# Patient Record
Sex: Male | Born: 1965 | Race: Black or African American | Hispanic: No | Marital: Married | Smoking: Never smoker
Health system: Southern US, Community
[De-identification: ages and names within clinical notes are randomized; demographics above are authoritative.]

## PROBLEM LIST (undated history)

## (undated) DIAGNOSIS — B20 Human immunodeficiency virus [HIV] disease: Secondary | ICD-10-CM

## (undated) DIAGNOSIS — K219 Gastro-esophageal reflux disease without esophagitis: Secondary | ICD-10-CM

## (undated) DIAGNOSIS — IMO0002 Reserved for concepts with insufficient information to code with codable children: Secondary | ICD-10-CM

## (undated) HISTORY — PX: UPPER GASTROINTESTINAL ENDOSCOPY: SHX188

## (undated) HISTORY — DX: Gastro-esophageal reflux disease without esophagitis: K21.9

## (undated) HISTORY — DX: Reserved for concepts with insufficient information to code with codable children: IMO0002

## (undated) HISTORY — PX: COLONOSCOPY: SHX174

## (undated) HISTORY — DX: Human immunodeficiency virus (HIV) disease: B20

## (undated) HISTORY — PX: MULTIPLE TOOTH EXTRACTIONS: SHX2053

---

## 1999-11-16 ENCOUNTER — Inpatient Hospital Stay (HOSPITAL_COMMUNITY): Admission: EM | Admit: 1999-11-16 | Discharge: 1999-11-18 | Payer: Self-pay

## 2006-03-08 ENCOUNTER — Ambulatory Visit: Payer: Self-pay | Admitting: Internal Medicine

## 2009-06-06 HISTORY — PX: OTHER SURGICAL HISTORY: SHX169

## 2010-01-03 ENCOUNTER — Ambulatory Visit: Payer: Self-pay | Admitting: Internal Medicine

## 2010-01-03 DIAGNOSIS — A630 Anogenital (venereal) warts: Secondary | ICD-10-CM

## 2010-01-03 DIAGNOSIS — K625 Hemorrhage of anus and rectum: Secondary | ICD-10-CM

## 2010-01-05 ENCOUNTER — Encounter: Payer: Self-pay | Admitting: Internal Medicine

## 2010-03-06 LAB — CONVERTED CEMR LAB
Cholesterol: 116 mg/dL (ref 0–200)
Glucose, Bld: 101 mg/dL — ABNORMAL HIGH (ref 70–99)
HDL: 35.8 mg/dL — ABNORMAL LOW (ref >39.0)
LDL Cholesterol: 72 mg/dL (ref 0–99)
Total CHOL/HDL Ratio: 3.2
Triglycerides: 43 mg/dL (ref 0–149)
VLDL: 9 mg/dL (ref 0–40)

## 2010-03-08 NOTE — Consult Note (Signed)
Summary: [facility]  [facility]   Imported By: Sherian Rein 01/11/2010 12:07:27  _____________________________________________________________________  External Attachment:    Type:   Image     Comment:   External Document  Appended Document: [facility] diagnosed with folliculitis

## 2010-03-08 NOTE — Assessment & Plan Note (Signed)
Summary: 2:00/30 M PER DEE /NEEDS TO BE CHECKED OUT AND WANTS TO DISCU...   Vital Signs:  Patient profile:   45 year old male Height:      73.25 inches Weight:      206 pounds BMI:     27.09 Temp:     98.5 degrees F oral Pulse rate:   70 / minute Pulse rhythm:   regular BP sitting:   130 / 80  (left arm) Cuff size:   large  Vitals Entered By: Mervin Hack CMA Duncan Dull) (January 03, 2010 2:07 PM) CC: check hemrrhoids   History of Present Illness: has been having some bright red blood on toilet paper intermittently for about 1 year None in stool or bowl Has rash or bumps that he can feel when he wipes  Bowels are regular---every morning after coffee  No stomach problems no N/V  weight is stable keeps busy with work  Has open area on right buttock from injury on riding mower about 1 month ago hasn't healed up  Preventive Screening-Counseling & Management  Alcohol-Tobacco     Smoking Status: never  Allergies (verified): No Known Drug Allergies  Past History:  Past Medical History: Unremarkable  Past Surgical History: Partial teeth removed  Family History: Dad died of MI @68  Mom helathy 1 brother 2 half sisters No HTN, DM No prostate or colon cancer  No colitis or Crohn's disease  Social History: Occupation:  Own Aeronautical engineer business Married--- 2 daughters Never Smoked Alcohol use-no Occupation:  employed Smoking Status:  never  Review of Systems  The patient denies chest pain, syncope, dyspnea on exertion, peripheral edema, prolonged cough, abdominal pain, severe indigestion/heartburn, depression, unusual weight change, and enlarged lymph nodes.         weight is not changed from last visit almost 4 years ago No wekaness No parasthesias  Physical Exam  General:  alert and normal appearance.   Neck:  supple, no masses, and normal carotid upstroke.   Lungs:  normal respiratory effort, no intercostal retractions, no accessory muscle use, and  normal breath sounds.   Heart:  normal rate, regular rhythm, no murmur, and no gallop.   Abdomen:  soft, non-tender, and no masses.   Rectal:  multiple apparent warts in the perirectal area no sig hemorrhoids no bleeding spots Stool brown and heme negative Genitalia:  no lesions on glans or shaft Prostate:  no gland enlargement and no nodules.   Extremities:  no edema Skin:  ulcer on right buttock Psych:  normally interactive, good eye contact, not anxious appearing, and not depressed appearing.     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 Information/Orders faxed/mailed.    Orders Added: 1)  Dermatology Referral [Derma] 2)  New Patient Level III [99203]   Immunization History:  Tetanus/Td Immunization History:    Tetanus/Td:  historical (03/08/2006)   Immunization History:  Tetanus/Td Immunization History:    Tetanus/Td:  Historical (03/08/2006)  Prior Medications: None Current Allergies (reviewed today): No known allergies   Appended Document: 2:00/30 M PER DEE /NEEDS TO BE CHECKED OUT AND WANTS TO DISCU...     Clinical Lists Changes  Orders: Added  new Service order of Admin 1st Vaccine (45409) - Signed Added new Service order of Flu Vaccine 71yrs + [phone]) - Signed Observations: Added new observation of FLU VAX VIS: 08/31/09 version (01/03/2010 14:45) Added new observation of FLU VAXLOT: [id number] (01/03/2010 14:45) Added new observation of FLU VAXMFR: [employer] (01/03/2010 14:45) Added new observation of FLU VAX EXP: 08/06/2010 (01/03/2010 14:45) Added new observation of FLU VAX DSE: 0.6ml (01/03/2010 14:45) Added new observation of FLU VAX: Fluvax 3+ (01/03/2010 14:45)Flu Vaccine Consent Questions     Do you have a history of severe allergic reactions to this vaccine? no    Any prior history of allergic reactions to egg and/or gelatin? no    Do you have a sensitivity to the preservative Thimersol? no    Do you have a past history of Guillan-Barre Syndrome? no    Do you currently have an acute febrile illness? no    Have you ever had a severe reaction to latex? no    Vaccine information given and explained to patient? yes    Are you currently pregnant? no    Lot Number:[id number]   Exp Date:08/06/2010   Site Given  Left Deltoid IM observation of FLU VAX DSE: 0.14ml (01/03/2010 14:45) Added new observation of FLU VAX: Fluvax 3+ (01/03/2010 14:45)     .[email or web address]
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
## 2010-07-12 ENCOUNTER — Ambulatory Visit: Payer: BC Managed Care – PPO

## 2010-07-14 ENCOUNTER — Ambulatory Visit: Payer: BC Managed Care – PPO

## 2010-07-23 ENCOUNTER — Encounter: Payer: Self-pay | Admitting: Internal Medicine

## 2010-07-25 ENCOUNTER — Encounter: Payer: Self-pay | Admitting: Family Medicine

## 2010-07-25 ENCOUNTER — Ambulatory Visit (INDEPENDENT_AMBULATORY_CARE_PROVIDER_SITE_OTHER): Payer: BC Managed Care – PPO | Admitting: Family Medicine

## 2010-07-25 DIAGNOSIS — Z21 Asymptomatic human immunodeficiency virus [HIV] infection status: Secondary | ICD-10-CM | POA: Insufficient documentation

## 2010-07-25 DIAGNOSIS — R1310 Dysphagia, unspecified: Secondary | ICD-10-CM | POA: Insufficient documentation

## 2010-07-25 DIAGNOSIS — Z113 Encounter for screening for infections with a predominantly sexual mode of transmission: Secondary | ICD-10-CM | POA: Insufficient documentation

## 2010-07-25 DIAGNOSIS — B20 Human immunodeficiency virus [HIV] disease: Secondary | ICD-10-CM | POA: Insufficient documentation

## 2010-07-25 LAB — RPR: RPR Ser Ql: REACTIVE — AB

## 2010-07-25 LAB — RPR TITER: RPR Titer: 1:1 {titer}

## 2010-07-25 MED ORDER — OMEPRAZOLE 40 MG PO CPDR: 40.0000 mg | DELAYED_RELEASE_CAPSULE | Freq: Every day | ORAL | Status: DC

## 2010-07-25 NOTE — Assessment & Plan Note (Signed)
Will rescreen for STDs per pt's request. Discussed protection 100% while testing returns, states will not be sexually active. H/o syphilis s/p treatment per patient 3 years ago.

## 2010-07-25 NOTE — Assessment & Plan Note (Addendum)
What sounds like esophageal dysphagia with some GERD sxs, new onset x 1wk.   Start PPI, set up with endoscopy, GI referral. ? Esophageal erosion vs PUD vs other. Worse with acidic drinks.  No red flags.

## 2010-07-25 NOTE — Patient Instructions (Signed)
Start omeprazole 40mg  daily for stomach. Avoidance of citrus, fatty foods, chocolate, peppermint, and excessive alcohol, along with sodas, orange juice (acidic drinks) At least a few hours between dinner and bed, minimize naps after eating. We will call you to set up swallow study. Blood work today. Call us with questions.
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
## 2010-07-26 ENCOUNTER — Encounter: Payer: Self-pay | Admitting: Gastroenterology

## 2010-07-26 LAB — HIV ANTIBODY (ROUTINE TESTING W REFLEX): HIV: REACTIVE

## 2010-07-26 LAB — HEPATITIS PANEL, ACUTE
HCV Ab: NEGATIVE
Hep A IgM: NEGATIVE
Hep B C IgM: NEGATIVE
Hepatitis B Surface Ag: NEGATIVE

## 2010-07-29 ENCOUNTER — Telehealth: Payer: Self-pay | Admitting: Family Medicine

## 2010-07-29 NOTE — Telephone Encounter (Signed)
Called Francis Pacheco to discuss positive results of tests, no answer, left message asking him to call our office today.

## 2010-07-29 NOTE — Telephone Encounter (Signed)
I called the health department and had to leave a message for the records dept to call me back. I called [facility] and they said they would send the records, but it may be Monday before we get them.

## 2010-07-29 NOTE — Telephone Encounter (Signed)
Spoke with [name]. Positive HIV screening, western blot pending.  2 positive screens so far, but discussed confirmatory test pending. Has appt set up with ID Dr. Orvan Falconer for Monday (I assume [facility] set this up).  Advised of appointment and to keep it. Hopefully western blot will be resulted then. Hepatitis panel negative. RPR and TP PA positive but titer only 1:1.  Per patient treated 3 years ago, likely false positive.  [name], can we obtain records from health department to verify treatment of syphillis ~3 years ago as well as recent blood work from [place]? Then fax latest OV note and blood work to ID clinic.

## 2010-08-01 ENCOUNTER — Ambulatory Visit: Payer: BC Managed Care – PPO | Admitting: Internal Medicine

## 2010-08-03 ENCOUNTER — Telehealth: Payer: Self-pay

## 2010-08-03 NOTE — Telephone Encounter (Signed)
Verified with [name] the appropriate fax #. [name] said fax to her attention at [phone]. Lab report faxed as instructed.

## 2010-08-03 NOTE — Telephone Encounter (Signed)
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      Pt has appt Monday with ID clinic.      Please fax results to ID clinic.

## 2010-08-08 NOTE — Telephone Encounter (Signed)
No that's fine. Thanks.

## 2010-08-08 NOTE — Telephone Encounter (Signed)
Spoke with Vicky at [employer]. She said he was originally dx on 12/21/1989 and was treated with bicillin. His titers have since been 1:4 and below, so no further treatment is needed. Do you want me to call him and have him come in for ROI signature for [facility] records?

## 2010-08-08 NOTE — Telephone Encounter (Signed)
[facility] requires ROI signed prior.  No news from health dept.

## 2010-08-09 ENCOUNTER — Ambulatory Visit: Payer: BC Managed Care – PPO | Admitting: Adult Health

## 2010-08-09 ENCOUNTER — Ambulatory Visit (INDEPENDENT_AMBULATORY_CARE_PROVIDER_SITE_OTHER): Payer: BC Managed Care – PPO

## 2010-08-09 DIAGNOSIS — Z23 Encounter for immunization: Secondary | ICD-10-CM

## 2010-08-09 DIAGNOSIS — R1310 Dysphagia, unspecified: Secondary | ICD-10-CM

## 2010-08-09 DIAGNOSIS — Z113 Encounter for screening for infections with a predominantly sexual mode of transmission: Secondary | ICD-10-CM

## 2010-08-09 DIAGNOSIS — B20 Human immunodeficiency virus [HIV] disease: Secondary | ICD-10-CM

## 2010-08-09 DIAGNOSIS — Z111 Encounter for screening for respiratory tuberculosis: Secondary | ICD-10-CM

## 2010-08-09 DIAGNOSIS — K069 Disorder of gingiva and edentulous alveolar ridge, unspecified: Secondary | ICD-10-CM

## 2010-08-09 DIAGNOSIS — K056 Periodontal disease, unspecified: Secondary | ICD-10-CM

## 2010-08-09 DIAGNOSIS — Z79899 Other long term (current) drug therapy: Secondary | ICD-10-CM

## 2010-08-09 LAB — URINALYSIS, ROUTINE W REFLEX MICROSCOPIC
Nitrite: NEGATIVE
Specific Gravity, Urine: 1.037 — ABNORMAL HIGH (ref 1.005–1.030)
Urobilinogen, UA: 1 mg/dL (ref 0.0–1.0)
pH: 6 (ref 5.0–8.0)

## 2010-08-09 MED ORDER — CHLORHEXIDINE GLUCONATE 0.12 % MT SOLN: 15.0000 mL | Freq: Two times a day (BID) | OROMUCOSAL | Status: AC

## 2010-08-09 MED ORDER — FLUCONAZOLE 100 MG PO TABS: 100.0000 mg | ORAL_TABLET | Freq: Every day | ORAL | Status: AC

## 2010-08-09 NOTE — Patient Instructions (Signed)
Or other is below 80 or is oriented to her problem is and then. I do you

## 2010-08-10 LAB — COMPREHENSIVE METABOLIC PANEL
Albumin: 3.9 g/dL (ref 3.5–5.2)
Alkaline Phosphatase: 60 U/L (ref 39–117)
BUN: 17 mg/dL (ref 6–23)
Creat: 0.89 mg/dL (ref 0.50–1.35)
Glucose, Bld: 84 mg/dL (ref 70–99)
Potassium: 3.7 mEq/L (ref 3.5–5.3)

## 2010-08-10 LAB — LIPID PANEL
Cholesterol: 107 mg/dL (ref 0–200)
LDL Cholesterol: 62 mg/dL (ref 0–99)
Total CHOL/HDL Ratio: 3.1 Ratio
Triglycerides: 48 mg/dL (ref <150)
VLDL: 10 mg/dL (ref 0–40)

## 2010-08-10 LAB — CBC WITH DIFFERENTIAL/PLATELET
Basophils Relative: 0 % (ref 0–1)
Eosinophils Absolute: 0.1 10*3/uL (ref 0.0–0.7)
Eosinophils Relative: 3 % (ref 0–5)
HCT: 32.2 % — ABNORMAL LOW (ref 39.0–52.0)
Hemoglobin: 10.9 g/dL — ABNORMAL LOW (ref 13.0–17.0)
MCH: 29.1 pg (ref 26.0–34.0)
MCHC: 33.9 g/dL (ref 30.0–36.0)
MCV: 85.9 fL (ref 78.0–100.0)
Monocytes Absolute: 0.3 10*3/uL (ref 0.1–1.0)
Monocytes Relative: 11 % (ref 3–12)
RDW: 12.7 % (ref 11.5–15.5)

## 2010-08-10 LAB — URINALYSIS, MICROSCOPIC ONLY

## 2010-08-10 LAB — HEPATITIS B SURFACE ANTIGEN: Hepatitis B Surface Ag: NEGATIVE

## 2010-08-11 LAB — T-HELPER CELL (CD4) - (RCID CLINIC ONLY): CD4 % Helper T Cell: 4 % — ABNORMAL LOW (ref 33–55)

## 2010-08-11 LAB — TB SKIN TEST

## 2010-08-14 ENCOUNTER — Emergency Department (HOSPITAL_COMMUNITY)
Admission: EM | Admit: 2010-08-14 | Discharge: 2010-08-14 | Disposition: A | Discharge status: Home / Self Care | Payer: BC Managed Care – PPO | Attending: Emergency Medicine | Admitting: Emergency Medicine

## 2010-08-14 DIAGNOSIS — R109 Unspecified abdominal pain: Secondary | ICD-10-CM | POA: Insufficient documentation

## 2010-08-17 ENCOUNTER — Encounter: Payer: Self-pay | Admitting: Adult Health

## 2010-08-17 ENCOUNTER — Ambulatory Visit (INDEPENDENT_AMBULATORY_CARE_PROVIDER_SITE_OTHER): Payer: BC Managed Care – PPO | Admitting: Adult Health

## 2010-08-17 DIAGNOSIS — L219 Seborrheic dermatitis, unspecified: Secondary | ICD-10-CM

## 2010-08-17 DIAGNOSIS — B350 Tinea barbae and tinea capitis: Secondary | ICD-10-CM | POA: Insufficient documentation

## 2010-08-17 DIAGNOSIS — B20 Human immunodeficiency virus [HIV] disease: Secondary | ICD-10-CM

## 2010-08-17 DIAGNOSIS — B356 Tinea cruris: Secondary | ICD-10-CM | POA: Insufficient documentation

## 2010-08-17 DIAGNOSIS — Z21 Asymptomatic human immunodeficiency virus [HIV] infection status: Secondary | ICD-10-CM

## 2010-08-17 DIAGNOSIS — R1310 Dysphagia, unspecified: Secondary | ICD-10-CM

## 2010-08-17 DIAGNOSIS — K056 Periodontal disease, unspecified: Secondary | ICD-10-CM

## 2010-08-17 LAB — HIV-1 GENOTYPR PLUS

## 2010-08-17 MED ORDER — SULFAMETHOXAZOLE-TMP DS 800-160 MG PO TABS: 1.0000 | ORAL_TABLET | Freq: Every day | ORAL | Status: AC

## 2010-08-17 MED ORDER — KETOCONAZOLE 2 % EX CREA: TOPICAL_CREAM | Freq: Two times a day (BID) | CUTANEOUS | Status: DC

## 2010-08-17 MED ORDER — AZITHROMYCIN 600 MG PO TABS: 1200.0000 mg | ORAL_TABLET | ORAL | Status: DC

## 2010-08-23 ENCOUNTER — Ambulatory Visit: Payer: BC Managed Care – PPO | Admitting: Adult Health

## 2010-08-31 ENCOUNTER — Encounter: Payer: Self-pay | Admitting: Adult Health

## 2010-08-31 ENCOUNTER — Ambulatory Visit (INDEPENDENT_AMBULATORY_CARE_PROVIDER_SITE_OTHER): Payer: BC Managed Care – PPO | Admitting: Adult Health

## 2010-08-31 VITALS — BP 132/75 | HR 81 | Temp 97.5°F | Ht 73.0 in | Wt 193.0 lb

## 2010-08-31 DIAGNOSIS — B20 Human immunodeficiency virus [HIV] disease: Secondary | ICD-10-CM

## 2010-08-31 DIAGNOSIS — Z21 Asymptomatic human immunodeficiency virus [HIV] infection status: Secondary | ICD-10-CM

## 2010-09-01 NOTE — Progress Notes (Signed)
  Subjective:    Patient ID: Francis Pacheco, male    DOB: [birth year], 45 y.o.   MRN: [id number]  HPI  Review of Systems     Objective:   Physical Exam        Assessment & Plan:

## 2010-09-01 NOTE — Progress Notes (Signed)
Presented to clinic for followup. Endorses appearance to his prophylaxis medications with good tolerance and no complications. He addressed interest in starting discussions regarding antiretroviral therapy. Also expressed willingness to discuss with Research about possible studies. We discussed HIV treatment strategies, goals of therapy, and possible regimens. We then referred him to Mrs. Epperson from [employer] for further discussion of the 5303 study. After he has discussed this with the research team, depending on his decision to participate, process screening, or eligibility we will either see him in one week or if on therapy. Labs in 4 weeks with a followup in 6 weeks. He verbally acknowledged this information and agreed with plan of care.

## 2010-09-07 ENCOUNTER — Encounter: Payer: Self-pay | Admitting: Gastroenterology

## 2010-09-07 ENCOUNTER — Ambulatory Visit (INDEPENDENT_AMBULATORY_CARE_PROVIDER_SITE_OTHER): Payer: BC Managed Care – PPO | Admitting: Gastroenterology

## 2010-09-07 VITALS — BP 132/76 | HR 94 | Ht 73.0 in | Wt 198.0 lb

## 2010-09-07 DIAGNOSIS — R1310 Dysphagia, unspecified: Secondary | ICD-10-CM

## 2010-09-07 DIAGNOSIS — R109 Unspecified abdominal pain: Secondary | ICD-10-CM

## 2010-09-07 NOTE — Patient Instructions (Signed)
Samples of PPI, take one pill 20-30 before breakfast meal. You will be set up for an upper endoscopy. Ibuprofen (NSAIDs) can cause ulcers, irritated stomach.  Tylenol is safer for your stomach.
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
## 2010-09-12 ENCOUNTER — Ambulatory Visit (AMBULATORY_SURGERY_CENTER): Payer: BC Managed Care – PPO | Admitting: Gastroenterology

## 2010-09-12 ENCOUNTER — Encounter: Payer: Self-pay | Admitting: Gastroenterology

## 2010-09-12 DIAGNOSIS — R109 Unspecified abdominal pain: Secondary | ICD-10-CM

## 2010-09-12 DIAGNOSIS — K2210 Ulcer of esophagus without bleeding: Secondary | ICD-10-CM

## 2010-09-12 MED ORDER — SODIUM CHLORIDE 0.9 % IV SOLN: 500.0000 mL | INTRAVENOUS | Status: DC

## 2010-09-12 NOTE — Patient Instructions (Signed)
Please refer to blue and green discharge instruction sheets. 

## 2010-09-12 NOTE — Progress Notes (Signed)
Daughter left unit and unable to reach via cellular number.  Pt moved to recliner room. 1529 2 daughters present.  EGD report discussed with per Dr. Christella Hartigan verbal order.  Daughters expressed understanding.

## 2010-09-13 ENCOUNTER — Telehealth: Payer: Self-pay | Admitting: *Deleted

## 2010-09-13 NOTE — Telephone Encounter (Signed)
Follow up Call- Patient questions:  Do you have a fever, pain , or abdominal swelling? yes Pain Score  2 *  Have you tolerated food without any problems? yes  Have you been able to return to your normal activities? yes  Do you have any questions about your discharge instructions: Diet   no Medications  yes Follow up visit  yes  Do you have questions or concerns about your Care? no  Actions: * If pain score is 4 or above: No action needed, pain <4. Patient stating his pain that prompted the endoscopy is the same. Patient unsure of results of procedure,or any additional medications ordered. Reviewed with patient the procedure results and referred the patient to his procedure report and discharge instructions. Patient stating his daughter had tried to inform him but he did not understand yesterday. Report in daughter's car. Patient  will retrieve and review. Reviewed his present medications. Patient states compliance with Prilosec. Patient taking Tylenol 500 mg for discomfort with good results. No further questions or concerns expressed by patient.

## 2010-09-14 ENCOUNTER — Encounter: Payer: Self-pay | Admitting: Internal Medicine

## 2010-09-14 NOTE — Progress Notes (Signed)
This encounter was created in error - please disregard.

## 2010-09-16 LAB — CMV CULTURE

## 2010-09-20 ENCOUNTER — Telehealth: Payer: Self-pay | Admitting: *Deleted

## 2010-09-20 NOTE — Telephone Encounter (Signed)
Called patient to try and schedule a follow-up appt based on the recommendation of Dr Christella Hartigan his GI doctor. The patient will not come in under any circumstance advised he is not ready for HIV medication. He said he doesn't want to deal with the side effects and can not afford them. Advised patient of assistance programs and he is still not interested as he is praying for guidance and he will call us when he is ready to take meds. Also advised the patient that the lesion in his throat is due to the HIV and the meds will help that he still refused. Told him to call us when he is ready please.

## 2010-10-10 ENCOUNTER — Emergency Department (HOSPITAL_COMMUNITY)
Admission: EM | Admit: 2010-10-10 | Discharge: 2010-10-10 | Disposition: A | Discharge status: Home / Self Care | Payer: BC Managed Care – PPO | Attending: Emergency Medicine | Admitting: Emergency Medicine

## 2010-10-10 DIAGNOSIS — R1011 Right upper quadrant pain: Secondary | ICD-10-CM | POA: Insufficient documentation

## 2010-10-10 DIAGNOSIS — R1013 Epigastric pain: Secondary | ICD-10-CM | POA: Insufficient documentation

## 2010-10-10 LAB — COMPREHENSIVE METABOLIC PANEL
ALT: 39 U/L (ref 0–53)
AST: 36 U/L (ref 0–37)
Albumin: 3.1 g/dL — ABNORMAL LOW (ref 3.5–5.2)
Alkaline Phosphatase: 51 U/L (ref 39–117)
GFR calc Af Amer: >60 mL/min (ref >60)
Glucose, Bld: 87 mg/dL (ref 70–99)
Potassium: 4.1 mEq/L (ref 3.5–5.1)
Sodium: 136 mEq/L (ref 135–145)
Total Protein: 7.8 g/dL (ref 6.0–8.3)

## 2010-10-10 LAB — URINE MICROSCOPIC-ADD ON

## 2010-10-10 LAB — DIFFERENTIAL
Basophils Absolute: 0 10*3/uL (ref 0.0–0.1)
Basophils Relative: 0 % (ref 0–1)
Eosinophils Absolute: 0.1 10*3/uL (ref 0.0–0.7)
Monocytes Absolute: 0.3 10*3/uL (ref 0.1–1.0)
Neutro Abs: 1.9 10*3/uL (ref 1.7–7.7)
Neutrophils Relative %: 57 % (ref 43–77)

## 2010-10-10 LAB — CBC
Hemoglobin: 10.1 g/dL — ABNORMAL LOW (ref 13.0–17.0)
MCHC: 33.9 g/dL (ref 30.0–36.0)
Platelets: 141 10*3/uL — ABNORMAL LOW (ref 150–400)

## 2010-10-10 LAB — URINALYSIS, ROUTINE W REFLEX MICROSCOPIC
Glucose, UA: NEGATIVE mg/dL
Hgb urine dipstick: NEGATIVE
Protein, ur: 100 mg/dL — AB
pH: 6 (ref 5.0–8.0)

## 2010-10-11 LAB — URINE CULTURE: Colony Count: NO GROWTH

## 2010-10-19 ENCOUNTER — Observation Stay (HOSPITAL_COMMUNITY)
Admission: EM | Admit: 2010-10-19 | Discharge: 2010-10-24 | DRG: 813 | Disposition: A | Discharge status: Home / Self Care | Payer: BC Managed Care – PPO | Attending: Internal Medicine | Admitting: Internal Medicine

## 2010-10-19 DIAGNOSIS — K838 Other specified diseases of biliary tract: Secondary | ICD-10-CM | POA: Insufficient documentation

## 2010-10-19 DIAGNOSIS — R112 Nausea with vomiting, unspecified: Secondary | ICD-10-CM | POA: Insufficient documentation

## 2010-10-19 DIAGNOSIS — K219 Gastro-esophageal reflux disease without esophagitis: Secondary | ICD-10-CM | POA: Diagnosis present

## 2010-10-19 DIAGNOSIS — A528 Late syphilis, latent: Secondary | ICD-10-CM | POA: Insufficient documentation

## 2010-10-19 DIAGNOSIS — D649 Anemia, unspecified: Secondary | ICD-10-CM | POA: Insufficient documentation

## 2010-10-19 DIAGNOSIS — R1011 Right upper quadrant pain: Principal | ICD-10-CM | POA: Diagnosis present

## 2010-10-19 DIAGNOSIS — K2210 Ulcer of esophagus without bleeding: Secondary | ICD-10-CM | POA: Diagnosis present

## 2010-10-19 DIAGNOSIS — R7402 Elevation of levels of lactic acid dehydrogenase (LDH): Secondary | ICD-10-CM | POA: Diagnosis present

## 2010-10-19 DIAGNOSIS — R059 Cough, unspecified: Secondary | ICD-10-CM | POA: Insufficient documentation

## 2010-10-19 DIAGNOSIS — K7689 Other specified diseases of liver: Secondary | ICD-10-CM | POA: Insufficient documentation

## 2010-10-19 DIAGNOSIS — R05 Cough: Secondary | ICD-10-CM | POA: Insufficient documentation

## 2010-10-19 DIAGNOSIS — I771 Stricture of artery: Secondary | ICD-10-CM | POA: Insufficient documentation

## 2010-10-19 DIAGNOSIS — E860 Dehydration: Secondary | ICD-10-CM | POA: Diagnosis present

## 2010-10-19 DIAGNOSIS — Z21 Asymptomatic human immunodeficiency virus [HIV] infection status: Secondary | ICD-10-CM | POA: Diagnosis present

## 2010-10-19 DIAGNOSIS — R7401 Elevation of levels of liver transaminase levels: Secondary | ICD-10-CM | POA: Diagnosis present

## 2010-10-19 DIAGNOSIS — R1310 Dysphagia, unspecified: Secondary | ICD-10-CM | POA: Insufficient documentation

## 2010-10-19 DIAGNOSIS — R209 Unspecified disturbances of skin sensation: Secondary | ICD-10-CM | POA: Diagnosis not present

## 2010-10-19 DIAGNOSIS — R195 Other fecal abnormalities: Secondary | ICD-10-CM | POA: Diagnosis present

## 2010-10-19 DIAGNOSIS — D709 Neutropenia, unspecified: Secondary | ICD-10-CM | POA: Insufficient documentation

## 2010-10-19 LAB — CBC
HCT: 27.7 % — ABNORMAL LOW (ref 39.0–52.0)
Hemoglobin: 9.3 g/dL — ABNORMAL LOW (ref 13.0–17.0)
MCH: 28.6 pg (ref 26.0–34.0)
MCHC: 33.6 g/dL (ref 30.0–36.0)
MCV: 85.2 fL (ref 78.0–100.0)
RBC: 3.25 MIL/uL — ABNORMAL LOW (ref 4.22–5.81)

## 2010-10-19 LAB — DIFFERENTIAL
Basophils Relative: 0 % (ref 0–1)
Lymphs Abs: 0.5 10*3/uL — ABNORMAL LOW (ref 0.7–4.0)
Monocytes Absolute: 0.4 10*3/uL (ref 0.1–1.0)
Monocytes Relative: 13 % — ABNORMAL HIGH (ref 3–12)
Neutro Abs: 1.8 10*3/uL (ref 1.7–7.7)
Neutrophils Relative %: 66 % (ref 43–77)

## 2010-10-20 ENCOUNTER — Encounter (HOSPITAL_COMMUNITY): Payer: Self-pay | Admitting: Radiology

## 2010-10-20 ENCOUNTER — Inpatient Hospital Stay (HOSPITAL_COMMUNITY): Payer: BC Managed Care – PPO

## 2010-10-20 LAB — LIPASE, BLOOD: Lipase: 17 U/L (ref 11–59)

## 2010-10-20 LAB — COMPREHENSIVE METABOLIC PANEL
ALT: 121 U/L — ABNORMAL HIGH (ref 0–53)
BUN: 18 mg/dL (ref 6–23)
CO2: 22 mEq/L (ref 19–32)
Calcium: 8.9 mg/dL (ref 8.4–10.5)
Creatinine, Ser: 0.9 mg/dL (ref 0.50–1.35)
GFR calc Af Amer: >60 mL/min (ref >60)
GFR calc non Af Amer: >60 mL/min (ref >60)
Glucose, Bld: 88 mg/dL (ref 70–99)
Sodium: 135 mEq/L (ref 135–145)
Total Protein: 7.8 g/dL (ref 6.0–8.3)

## 2010-10-20 LAB — ABO/RH: ABO/RH(D): B NEG

## 2010-10-20 LAB — PREPARE RBC (CROSSMATCH)

## 2010-10-20 MED ORDER — IOHEXOL 300 MG/ML  SOLN
100.0000 mL | Freq: Once | INTRAMUSCULAR | Status: AC | PRN
  Administered 2010-10-20: 100 mL via INTRAVENOUS

## 2010-10-20 NOTE — H&P (Signed)
NAME:  Francis Pacheco, Francis Pacheco NO.:  [id number]  MEDICAL RECORD NO.:  [id number]  LOCATION:  [facility]                         FACILITY:  [facility]  PHYSICIAN:  Talmage Nap, MD  DATE OF BIRTH:  Nov 01, 1965  DATE OF ADMISSION:  10/19/2010 DATE OF DISCHARGE:                             HISTORY & PHYSICAL   PRIMARY CARE PHYSICIAN:  Tillman Abide, M.D.  CHIEF COMPLAINT:  Right upper quadrant pain on and off of about a month duration.  HISTORY OF PRESENT ILLNESS:  The patient is a 45 year old African- American male with a history of gastric ulcer who is said to be HIV positive, which patient denied; however, CD4 count done on August 09, 2010 was 40 very low and T-lymphocyte percentage was very low presenting to the hospital with a 84-month history of right upper quadrant pain, which apparently the patient has been on and off.  The pain is described as colicky about 8-9/10 in intensity radiating to the bifurcation with associated nausea, but the patient denied any vomiting.  He denied any diarrhea.  He denied any fever.  He denied any chills.  He denied any rigor.  Pain was said to have gotten worse 24 hours prior to presenting to the emergency room and with associated multiple episodes of nausea, hence he decided to come to the emergency room to be evaluated.  PAST MEDICAL HISTORY: 1. Positive for gastric ulcer. 2. Blunt trauma to upper back. 3. Fracture of the left scapula. 4. Bilateral refracture. 5. Fracture of the transverse process of C7, T1, and T2 6. Third and fourth posterior rib fracture.  PAST SURGICAL HISTORY:  He has no known past surgical history.  PREADMISSION MEDICATIONS:  Include Carafate and Prilosec.  ALLERGIES:  No known allergies.  SOCIAL HISTORY:  Negative for alcohol or tobacco use.  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  FAMILY HISTORY:  Positive for coronary artery disease.  REVIEW OF SYSTEMS:  The patient denies any history  of headaches. Complained about nausea, but no vomiting.  Denies any chest pain or shortness of breath.  No fever.  No chills.  No rigor.  No cough. Complained about pain in the right upper quadrant radiating to the back with associated nausea, but denied any vomiting.  No diarrhea or hematochezia.  No dysuria or hematuria.  No swelling of the lower extremity.  No intolerance to heat or cold.  No neuropsychiatric disorder.  PHYSICAL EXAMINATION:  GENERAL:  Middle-aged man, dehydrated, not in any obvious respiratory distress. VITAL SIGNS:  Blood pressure is 102/58, pulse 62, respiratory rate is 18, temperature is 99.4. HEENT:  Pallor, but pupils are reactive to light and extraocular muscles are intact. NECK:  No jugular venous distention.  No carotid bruit.  No lymphadenopathy. CHEST:  Clear to auscultation. HEART:  Sounds are 1 and 2. ABDOMEN:  Soft with tenderness in the right upper quadrant.  No guarding.  No rigidity.  Liver, spleen, kidney not palpable.  Bowel sounds are positive. EXTREMITIES: No pedal edema. NEUROLOGIC:  Nonfocal. MUSCULOSKELETAL:  Unremarkable. SKIN:  Shows slightly decreased turgor.  LABORATORY STUDIES:  Lipase is 17.  Chemistry shows sodium of 135, potassium of 3.4, chloride of 100,  with a bicarb of 22, glucose is 88, BUN is 18, creatinine is 0.90.  LFT showed total protein 7.8, albumin is 3.0, AST is 133, ALT 121, and alkaline phosphatase of 54.  Hematological indices showed WBC of 2.7, hemoglobin 9.3, hematocrit of 27.7, MCV of 85.2, with a platelet count of 162.  IMPRESSION: 1. Right upper quadrant pain, rule out gallbladder disease or biliary     dyskinesia. 2. Dehydration. 3. Anemia. 4. Neutropenia, rule out human immunodeficiency virus. 5. Abnormal LFT, rule out hepatitis. 6. History of gastric cancer.  PLAN: 1. Admit the patient to general medical floor.  The patient will be     given normal saline IV to go at a rate of 120 mL an hour and  pain     control will be done with Dilaudid 2 mg IV q.4 p.r.n.  GI     prophylaxis with Protonix 40 mg IV q.24 and DVT prophylaxis with     TED stockings.  Further workup to be done on this patient will     include HIV tests. 2. Hepatitis panel A, B, and C. 3. CBC, CMP, and magnesium will be repeated in a.m.  Imaging study to     be ordered on this patient will include CT of the abdomen and     pelvis with and without contrast.  The patient will be followed and     evaluated on day-to-day basis.     Talmage Nap, MD     CN/MEDQ  D:  10/20/2010  T:  10/20/2010  Job:  [id number]  Electronically Signed by Talmage Nap  on 10/20/2010 06:08:10 AM

## 2010-10-21 ENCOUNTER — Inpatient Hospital Stay (HOSPITAL_COMMUNITY): Payer: BC Managed Care – PPO

## 2010-10-21 DIAGNOSIS — R740 Nonspecific elevation of levels of transaminase and lactic acid dehydrogenase [LDH]: Secondary | ICD-10-CM

## 2010-10-21 DIAGNOSIS — B20 Human immunodeficiency virus [HIV] disease: Secondary | ICD-10-CM

## 2010-10-21 DIAGNOSIS — R1011 Right upper quadrant pain: Secondary | ICD-10-CM

## 2010-10-21 LAB — COMPREHENSIVE METABOLIC PANEL
ALT: 186 U/L — ABNORMAL HIGH (ref 0–53)
Alkaline Phosphatase: 61 U/L (ref 39–117)
BUN: 16 mg/dL (ref 6–23)
CO2: 25 mEq/L (ref 19–32)
GFR calc Af Amer: >60 mL/min (ref >60)
GFR calc non Af Amer: >60 mL/min (ref >60)
Glucose, Bld: 88 mg/dL (ref 70–99)
Potassium: 3.9 mEq/L (ref 3.5–5.1)
Sodium: 135 mEq/L (ref 135–145)
Total Bilirubin: 0.5 mg/dL (ref 0.3–1.2)

## 2010-10-21 LAB — TYPE AND SCREEN
ABO/RH(D): B NEG
Antibody Screen: NEGATIVE
Unit division: 0

## 2010-10-21 LAB — DIFFERENTIAL
Basophils Absolute: 0 10*3/uL (ref 0.0–0.1)
Basophils Relative: 0 % (ref 0–1)
Lymphocytes Relative: 40 % (ref 12–46)
Monocytes Relative: 11 % (ref 3–12)
Neutro Abs: 1.4 10*3/uL — ABNORMAL LOW (ref 1.7–7.7)
Neutrophils Relative %: 48 % (ref 43–77)

## 2010-10-21 LAB — CBC
HCT: 27.2 % — ABNORMAL LOW (ref 39.0–52.0)
Hemoglobin: 9 g/dL — ABNORMAL LOW (ref 13.0–17.0)
RBC: 3.14 MIL/uL — ABNORMAL LOW (ref 4.22–5.81)

## 2010-10-21 LAB — MAGNESIUM: Magnesium: 1.7 mg/dL (ref 1.5–2.5)

## 2010-10-21 LAB — SEDIMENTATION RATE: Sed Rate: 30 mm/hr — ABNORMAL HIGH (ref 0–16)

## 2010-10-21 LAB — CD4/CD8 (T-HELPER/T-SUPPRESSOR CELL)
CD4 absolute: 50 /uL — ABNORMAL LOW (ref 500–1900)
CD8tox: 77 % — ABNORMAL HIGH (ref 15.0–40.0)
Ratio: 0.06 — ABNORMAL LOW (ref 1.0–3.0)

## 2010-10-22 ENCOUNTER — Inpatient Hospital Stay (HOSPITAL_COMMUNITY): Payer: BC Managed Care – PPO

## 2010-10-22 DIAGNOSIS — R740 Nonspecific elevation of levels of transaminase and lactic acid dehydrogenase [LDH]: Secondary | ICD-10-CM

## 2010-10-22 DIAGNOSIS — R1011 Right upper quadrant pain: Secondary | ICD-10-CM

## 2010-10-22 LAB — COMPREHENSIVE METABOLIC PANEL
ALT: 166 U/L — ABNORMAL HIGH (ref 0–53)
AST: 143 U/L — ABNORMAL HIGH (ref 0–37)
Albumin: 2.4 g/dL — ABNORMAL LOW (ref 3.5–5.2)
Alkaline Phosphatase: 61 U/L (ref 39–117)
Calcium: 8.2 mg/dL — ABNORMAL LOW (ref 8.4–10.5)
GFR calc Af Amer: >60 mL/min (ref >60)
Glucose, Bld: 81 mg/dL (ref 70–99)
Potassium: 3.5 mEq/L (ref 3.5–5.1)
Sodium: 132 mEq/L — ABNORMAL LOW (ref 135–145)
Total Protein: 6.3 g/dL (ref 6.0–8.3)

## 2010-10-23 ENCOUNTER — Inpatient Hospital Stay (HOSPITAL_COMMUNITY): Payer: BC Managed Care – PPO

## 2010-10-23 DIAGNOSIS — R740 Nonspecific elevation of levels of transaminase and lactic acid dehydrogenase [LDH]: Secondary | ICD-10-CM

## 2010-10-23 DIAGNOSIS — R1011 Right upper quadrant pain: Secondary | ICD-10-CM

## 2010-10-23 LAB — COMPREHENSIVE METABOLIC PANEL
AST: 84 U/L — ABNORMAL HIGH (ref 0–37)
BUN: 7 mg/dL (ref 6–23)
CO2: 26 mEq/L (ref 19–32)
Calcium: 8.3 mg/dL — ABNORMAL LOW (ref 8.4–10.5)
Chloride: 100 mEq/L (ref 96–112)
Creatinine, Ser: 0.68 mg/dL (ref 0.50–1.35)
GFR calc Af Amer: >60 mL/min (ref >60)
GFR calc non Af Amer: >60 mL/min (ref >60)
Glucose, Bld: 86 mg/dL (ref 70–99)
Total Bilirubin: 0.6 mg/dL (ref 0.3–1.2)

## 2010-10-24 ENCOUNTER — Inpatient Hospital Stay (HOSPITAL_COMMUNITY): Payer: BC Managed Care – PPO

## 2010-10-24 DIAGNOSIS — R1011 Right upper quadrant pain: Secondary | ICD-10-CM

## 2010-10-24 DIAGNOSIS — R740 Nonspecific elevation of levels of transaminase and lactic acid dehydrogenase [LDH]: Secondary | ICD-10-CM

## 2010-10-24 LAB — HEPATIC FUNCTION PANEL
AST: 51 U/L — ABNORMAL HIGH (ref 0–37)
Albumin: 2.5 g/dL — ABNORMAL LOW (ref 3.5–5.2)
Total Bilirubin: 0.6 mg/dL (ref 0.3–1.2)

## 2010-10-24 MED ORDER — TECHNETIUM TC 99M MEBROFENIN IV KIT
5.3000 | PACK | Freq: Once | INTRAVENOUS | Status: AC | PRN
  Administered 2010-10-24: 5 via INTRAVENOUS

## 2010-10-24 MED ORDER — SINCALIDE 5 MCG IJ SOLR: 0.0200 ug/kg | Freq: Once | INTRAMUSCULAR | Status: DC

## 2010-10-25 ENCOUNTER — Ambulatory Visit (INDEPENDENT_AMBULATORY_CARE_PROVIDER_SITE_OTHER): Payer: BC Managed Care – PPO | Admitting: Adult Health

## 2010-10-25 ENCOUNTER — Encounter: Payer: Self-pay | Admitting: Adult Health

## 2010-10-25 VITALS — BP 150/86 | HR 77 | Temp 98.1°F | Ht 73.0 in | Wt 185.0 lb

## 2010-10-25 DIAGNOSIS — B20 Human immunodeficiency virus [HIV] disease: Secondary | ICD-10-CM

## 2010-10-25 DIAGNOSIS — Z21 Asymptomatic human immunodeficiency virus [HIV] infection status: Secondary | ICD-10-CM

## 2010-10-25 LAB — HIV 1/2 CONFIRMATION
HIV-1 antibody: POSITIVE
HIV-2 Ab: NEGATIVE

## 2010-10-25 MED ORDER — EMTRICITABINE-TENOFOVIR DF 200-300 MG PO TABS: 1.0000 | ORAL_TABLET | Freq: Every day | ORAL | Status: DC

## 2010-10-25 MED ORDER — RALTEGRAVIR POTASSIUM 400 MG PO TABS: 400.0000 mg | ORAL_TABLET | Freq: Two times a day (BID) | ORAL | Status: DC

## 2010-10-25 NOTE — Patient Instructions (Signed)
1. Take one Isentress tablet by mouth twice daily. 2. Take one blue Truvada tablet by mouth once daily. 3. Be certain to call the toll-free Number on your co-pay cards BEFORE you present them to your pharmacist. 4. Return to clinic for labs in 4 weeks. 5. Schedule a provider. Followup for 6 weeks.

## 2010-10-27 ENCOUNTER — Telehealth: Payer: Self-pay | Admitting: *Deleted

## 2010-10-27 NOTE — Telephone Encounter (Signed)
Called patient per providers request, and advised him to stop taking the Atripla that he was prescribed upon release from as it would interfere with the current medications he is on. Patient said he would and that he understood what we discussed.

## 2010-11-10 ENCOUNTER — Other Ambulatory Visit: Payer: Self-pay | Admitting: *Deleted

## 2010-11-10 DIAGNOSIS — B20 Human immunodeficiency virus [HIV] disease: Secondary | ICD-10-CM

## 2010-11-10 MED ORDER — AZITHROMYCIN 600 MG PO TABS: 1200.0000 mg | ORAL_TABLET | ORAL | Status: DC

## 2010-11-10 NOTE — Telephone Encounter (Signed)
Patient called advised he is out to this med and needs refill.

## 2010-11-21 ENCOUNTER — Ambulatory Visit (INDEPENDENT_AMBULATORY_CARE_PROVIDER_SITE_OTHER): Payer: BC Managed Care – PPO | Admitting: Gastroenterology

## 2010-11-21 ENCOUNTER — Encounter: Payer: Self-pay | Admitting: Gastroenterology

## 2010-11-21 VITALS — BP 132/98 | HR 84 | Ht 73.0 in | Wt 191.0 lb

## 2010-11-21 DIAGNOSIS — K2210 Ulcer of esophagus without bleeding: Secondary | ICD-10-CM

## 2010-11-21 NOTE — Patient Instructions (Addendum)
You will be set up for an upper endoscopy to check for healing of signficant esophageal ulcer (LEC). Continue on your HIV meds. Stay on once daily prilosec until repeat EGD.
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
## 2010-11-22 ENCOUNTER — Other Ambulatory Visit: Payer: Self-pay | Admitting: *Deleted

## 2010-11-22 ENCOUNTER — Other Ambulatory Visit: Payer: BC Managed Care – PPO

## 2010-11-22 ENCOUNTER — Other Ambulatory Visit: Payer: Self-pay | Admitting: Infectious Diseases

## 2010-11-22 ENCOUNTER — Other Ambulatory Visit: Payer: Self-pay

## 2010-11-22 DIAGNOSIS — B20 Human immunodeficiency virus [HIV] disease: Secondary | ICD-10-CM

## 2010-11-22 DIAGNOSIS — IMO0002 Reserved for concepts with insufficient information to code with codable children: Secondary | ICD-10-CM

## 2010-11-22 MED ORDER — RALTEGRAVIR POTASSIUM 400 MG PO TABS: 400.0000 mg | ORAL_TABLET | Freq: Two times a day (BID) | ORAL | Status: DC

## 2010-11-22 MED ORDER — FLUCONAZOLE 100 MG PO TABS: 100.0000 mg | ORAL_TABLET | Freq: Every day | ORAL | Status: DC

## 2010-11-22 MED ORDER — EMTRICITABINE-TENOFOVIR DF 200-300 MG PO TABS: 1.0000 | ORAL_TABLET | Freq: Every day | ORAL | Status: DC

## 2010-11-22 MED ORDER — OMEPRAZOLE 40 MG PO CPDR: 40.0000 mg | DELAYED_RELEASE_CAPSULE | Freq: Every day | ORAL | Status: DC

## 2010-11-22 NOTE — Telephone Encounter (Signed)
Sent hiv meds to his [facility] as a refill.  I tried calling him back as we have co-pay cards here. I will keep trying. I lm to stop by to get them

## 2010-11-23 LAB — COMPLETE METABOLIC PANEL WITH GFR
AST: 25 U/L (ref 0–37)
Albumin: 4 g/dL (ref 3.5–5.2)
Alkaline Phosphatase: 74 U/L (ref 39–117)
Chloride: 103 mEq/L (ref 96–112)
Potassium: 4.1 mEq/L (ref 3.5–5.3)
Sodium: 138 mEq/L (ref 135–145)
Total Protein: 8.1 g/dL (ref 6.0–8.3)

## 2010-11-23 LAB — CBC WITH DIFFERENTIAL/PLATELET
Basophils Absolute: 0 10*3/uL (ref 0.0–0.1)
Basophils Relative: 1 % (ref 0–1)
Hemoglobin: 12.4 g/dL — ABNORMAL LOW (ref 13.0–17.0)
Lymphocytes Relative: 56 % — ABNORMAL HIGH (ref 12–46)
MCHC: 34 g/dL (ref 30.0–36.0)
Monocytes Relative: 11 % (ref 3–12)
Neutro Abs: 1.1 10*3/uL — ABNORMAL LOW (ref 1.7–7.7)
Neutrophils Relative %: 28 % — ABNORMAL LOW (ref 43–77)
RDW: 14.7 % (ref 11.5–15.5)
WBC: 3.9 10*3/uL — ABNORMAL LOW (ref 4.0–10.5)

## 2010-11-23 LAB — T-HELPER CELL (CD4) - (RCID CLINIC ONLY): CD4 % Helper T Cell: 5 % — ABNORMAL LOW (ref 33–55)

## 2010-11-24 ENCOUNTER — Other Ambulatory Visit: Payer: Self-pay | Admitting: *Deleted

## 2010-11-24 NOTE — Telephone Encounter (Signed)
He called from pharmacy & told me he cannot afford the co-pay costs. I had told him yesterday I had co-pay cards at front. He will come get them. I will help him activate the cards

## 2010-12-06 ENCOUNTER — Encounter: Payer: Self-pay | Admitting: Internal Medicine

## 2010-12-06 ENCOUNTER — Ambulatory Visit (INDEPENDENT_AMBULATORY_CARE_PROVIDER_SITE_OTHER): Payer: BC Managed Care – PPO | Admitting: Internal Medicine

## 2010-12-06 VITALS — BP 157/95 | HR 86 | Temp 97.3°F | Ht 73.0 in | Wt 199.0 lb

## 2010-12-06 DIAGNOSIS — Z23 Encounter for immunization: Secondary | ICD-10-CM

## 2010-12-06 DIAGNOSIS — Z21 Asymptomatic human immunodeficiency virus [HIV] infection status: Secondary | ICD-10-CM

## 2010-12-06 DIAGNOSIS — B20 Human immunodeficiency virus [HIV] disease: Secondary | ICD-10-CM

## 2010-12-06 MED ORDER — SULFAMETHOXAZOLE-TRIMETHOPRIM 800-160 MG PO TABS: 1.0000 | ORAL_TABLET | Freq: Every day | ORAL | Status: AC

## 2010-12-06 NOTE — Progress Notes (Signed)
  Subjective:    Patient ID: Francis Pacheco, male    DOB: Jul 10, 1965, 44 y.o.   MRN: [id number]  HPI Francis Pacheco is in for his routine visit. I am seeing him today for my partner, Traci Sermon. He is feeling much better. His odynophagia has resolved, his appetite has improved and he is gaining weight. He has not missed a single dose of his Truvada or Isentress. He is also taking his Prilosec. He believes he is also on one other medication that he only takes once a week but he is not sure what it is. His wife oversees his medications and make sure he never misses. He does not appear that he is on PCP prophylaxis.    Review of Systems     Objective:   Physical Exam  Constitutional: He appears well-developed and well-nourished. No distress.       He is in very good spirits.  HENT:  Mouth/Throat: Oropharynx is clear and moist. No oropharyngeal exudate.  Cardiovascular: Normal rate, regular rhythm and normal heart sounds.   No murmur heard. Pulmonary/Chest: Breath sounds normal. He has no wheezes. He has no rales.  Skin: No rash noted.  Psychiatric: He has a normal mood and affect.          Assessment & Plan:

## 2010-12-06 NOTE — Assessment & Plan Note (Signed)
His infection has responded very well to the institution of antiretroviral therapy 3 months ago. His CD4 count has increased from 40 to 120 and his viral load has decreased from 109,000 to 113. His HIV related to esophageal ulcer appears to have healed in nicely as his CD4 count is reconstituted. I will have him check his home medications against an updated list that I have given him today. I will have him start Septra PCP prophylaxis. He received his influenza vaccination today.

## 2010-12-06 NOTE — Progress Notes (Signed)
Addended by: Jennet Maduro D on: 12/06/2010 04:09 PM   Modules accepted: Orders

## 2010-12-20 ENCOUNTER — Ambulatory Visit: Payer: BC Managed Care – PPO | Admitting: Gastroenterology

## 2010-12-20 ENCOUNTER — Encounter: Payer: Self-pay | Admitting: Gastroenterology

## 2010-12-20 VITALS — Temp 98.7°F

## 2010-12-20 NOTE — Progress Notes (Signed)
PT. STATED HE ATE [facility] AND HAD A SODA ABOUT 1300 TODAY, DR. Christella Hartigan MADE AWARE CAME INTO ADMITTING TO TALK TO PT, DR. Christella Hartigan EXPLAINED RISK OF PT. ASPIRATING AND DECIDED THAT PROCEDURE NEED TO BE RESCHEDULED. DR. Christella Hartigan STATED THAT HE WILL NOTIFY PATTY TO RESCHEDULE. 1509 PATTY INTO ADMISSION PT. AND WIFE TAKEN TO THIRD FLOOR TO RESCHEDULE PROCEDURE FOR EGD.

## 2011-01-16 ENCOUNTER — Ambulatory Visit (AMBULATORY_SURGERY_CENTER): Payer: BC Managed Care – PPO | Admitting: Gastroenterology

## 2011-01-16 ENCOUNTER — Encounter: Payer: Self-pay | Admitting: Gastroenterology

## 2011-01-16 VITALS — BP 124/80 | HR 65 | Temp 98.0°F | Resp 14 | Ht 73.0 in | Wt 191.0 lb

## 2011-01-16 DIAGNOSIS — K2210 Ulcer of esophagus without bleeding: Secondary | ICD-10-CM

## 2011-01-16 DIAGNOSIS — K209 Esophagitis, unspecified: Secondary | ICD-10-CM

## 2011-01-16 DIAGNOSIS — R933 Abnormal findings on diagnostic imaging of other parts of digestive tract: Secondary | ICD-10-CM

## 2011-01-16 MED ORDER — SODIUM CHLORIDE 0.9 % IV SOLN: 500.0000 mL | INTRAVENOUS | Status: DC

## 2011-01-16 NOTE — Progress Notes (Signed)
Patient did not experience any of the following events: a burn prior to discharge; a fall within the facility; wrong site/side/patient/procedure/implant event; or a hospital transfer or hospital admission upon discharge from the facility. (G8907) Patient did not have preoperative order for IV antibiotic SSI prophylaxis. (G8918)  

## 2011-01-16 NOTE — Op Note (Signed)
[facility] [address] [place], [place]  [address]  ENDOSCOPY PROCEDURE REPORT  PATIENT:  Francis Pacheco, Francis Pacheco  MR#:  [id number] BIRTHDATE:  17-Dec-1965, 45 yrs. old  GENDER:  male ENDOSCOPIST:  Rachael Fee, MD PROCEDURE DATE:  01/16/2011 PROCEDURE:  EGD with biopsy, 43239 ASA CLASS:  Class II INDICATIONS:  previous large distal esophagus ulcer, probably primary HIV related (previous biopsies showed no clear viral issues), since then has been on HAART MEDICATIONS:   Fentanyl 50 mcg IV, These medications were titrated to patient response per physician's verbal order, Versed 4 mg IV TOPICAL ANESTHETIC:  Cetacaine Spray  DESCRIPTION OF PROCEDURE:   After the risks benefits and alternatives of the procedure were thoroughly explained, informed consent was obtained.  The LB GIF-H180 [id number] endoscope was introduced through the mouth and advanced to the second portion of the duodenum, without limitations.  The instrument was slowly withdrawn as the mucosa was fully examined. <<PROCEDUREIMAGES>> The previously noted distal esophagus ulcer has healed. There was a 3cm long mucosal bridge at the site, this was biopsied with foreps, felt very fibrous (path jar 1) (see image1 and image6). Otherwise the examination was normal (see image3, image4, image5, and image2).    Retroflexed views revealed no abnormalities. The scope was then withdrawn from the patient and the procedure completed. COMPLICATIONS:  None  ENDOSCOPIC IMPRESSION: 1) Mucosal bridge (healing tissue) at site of previous large esophageal ulcer 2) Otherwise normal examination  RECOMMENDATIONS: Await final pathology.  ______________________________ Rachael Fee, MD  CC:  Cliffton Asters, MD  n. eSIGNED:   Rachael Fee at 01/16/2011 08:44 AM  Gabriel Carina, [phone]

## 2011-01-16 NOTE — Patient Instructions (Signed)
Please refer to your blue and neon green sheets for instructions regarding diet and activity for the rest of today.  You may resume your medications as you would normally take them.   You will receive a letter in the mail in about two weeks regarding the results of the biopsies taken today. 

## 2011-01-17 ENCOUNTER — Telehealth: Payer: Self-pay | Admitting: *Deleted

## 2011-01-17 NOTE — Telephone Encounter (Signed)
Duplicate . Please see other [email or web address]

## 2011-01-17 NOTE — Telephone Encounter (Signed)

## 2011-01-25 ENCOUNTER — Encounter: Payer: Self-pay | Admitting: *Deleted

## 2011-02-22 ENCOUNTER — Other Ambulatory Visit: Payer: Self-pay | Admitting: *Deleted

## 2011-02-22 ENCOUNTER — Other Ambulatory Visit: Payer: Self-pay | Admitting: Internal Medicine

## 2011-02-22 ENCOUNTER — Other Ambulatory Visit: Payer: BC Managed Care – PPO

## 2011-02-22 ENCOUNTER — Other Ambulatory Visit (INDEPENDENT_AMBULATORY_CARE_PROVIDER_SITE_OTHER): Payer: Managed Care, Other (non HMO)

## 2011-02-22 DIAGNOSIS — Z21 Asymptomatic human immunodeficiency virus [HIV] infection status: Secondary | ICD-10-CM

## 2011-02-22 DIAGNOSIS — B20 Human immunodeficiency virus [HIV] disease: Secondary | ICD-10-CM

## 2011-02-22 DIAGNOSIS — Z113 Encounter for screening for infections with a predominantly sexual mode of transmission: Secondary | ICD-10-CM

## 2011-02-22 DIAGNOSIS — K2210 Ulcer of esophagus without bleeding: Secondary | ICD-10-CM

## 2011-02-22 DIAGNOSIS — Z7901 Long term (current) use of anticoagulants: Secondary | ICD-10-CM

## 2011-02-22 LAB — CBC
HCT: 40.3 % (ref 39.0–52.0)
Hemoglobin: 13.9 g/dL (ref 13.0–17.0)
MCHC: 34.5 g/dL (ref 30.0–36.0)
MCV: 88.6 fL (ref 78.0–100.0)
RDW: 12.7 % (ref 11.5–15.5)

## 2011-02-22 LAB — RPR

## 2011-02-22 LAB — COMPLETE METABOLIC PANEL WITH GFR
AST: 27 U/L (ref 0–37)
Albumin: 4.2 g/dL (ref 3.5–5.2)
Alkaline Phosphatase: 84 U/L (ref 39–117)
Potassium: 4.4 mEq/L (ref 3.5–5.3)
Sodium: 136 mEq/L (ref 135–145)
Total Protein: 8.2 g/dL (ref 6.0–8.3)

## 2011-02-22 LAB — LIPID PANEL
LDL Cholesterol: 67 mg/dL (ref 0–99)
Total CHOL/HDL Ratio: 3.1 Ratio
VLDL: 16 mg/dL (ref 0–40)

## 2011-02-22 MED ORDER — AZITHROMYCIN 600 MG PO TABS
1200.0000 mg | ORAL_TABLET | ORAL | Status: DC
Start: 2011-02-22 — End: 2011-03-07

## 2011-02-22 NOTE — Telephone Encounter (Signed)
walk-in to clinic, needing refills and co-pay cards

## 2011-02-24 LAB — HIV-1 RNA QUANT-NO REFLEX-BLD
HIV 1 RNA Quant: <20 copies/mL (ref <20)
HIV-1 RNA Quant, Log: <1.3 {Log} (ref <1.30)

## 2011-03-07 ENCOUNTER — Ambulatory Visit (INDEPENDENT_AMBULATORY_CARE_PROVIDER_SITE_OTHER): Payer: Managed Care, Other (non HMO) | Admitting: Internal Medicine

## 2011-03-07 ENCOUNTER — Encounter: Payer: Self-pay | Admitting: Internal Medicine

## 2011-03-07 VITALS — BP 148/95 | HR 84 | Temp 98.2°F | Ht 73.0 in | Wt 213.8 lb

## 2011-03-07 DIAGNOSIS — B20 Human immunodeficiency virus [HIV] disease: Secondary | ICD-10-CM

## 2011-03-07 DIAGNOSIS — Z21 Asymptomatic human immunodeficiency virus [HIV] infection status: Secondary | ICD-10-CM

## 2011-03-07 NOTE — Progress Notes (Signed)
Patient ID: Ellsworth Waldschmidt, male   DOB: 03/08/1965, 46 y.o.   MRN: [id number] Subjective:    Patient ID: Damein Gaunce is a 46 y.o. male.  Chief Complaint: HIV Follow-up Visit Elber Galyean is here for follow-up of HIV infection. He is feeling worse since his last visit. His esophageal ulcer continues to worsening with associated pain, and dysphagia. He continues to have night sweats, nonexertional, fatigue, and continued weight loss.   Data Review: Diagnostic studies reviewed.  Review of Systems - General ROS: positive for  - chills, fatigue, malaise, night sweats and sleep disturbance Psychological ROS: negative for - anxiety, behavioral disorder, concentration difficulties or mood swings Ophthalmic ROS: negative for - blurry vision, decreased vision or double vision Respiratory ROS: no cough, shortness of breath, or wheezing Cardiovascular ROS: no chest pain or dyspnea on exertion Gastrointestinal ROS: positive for - heartburn and swallowing difficulty/pain Neurological ROS: no TIA or stroke symptoms Dermatological ROS: negative for pruritus, rash and skin lesion changes  Objective:  General appearance: alert, cooperative, appears stated age, fatigued and mild distress Head: Normocephalic, without obvious abnormality, atraumatic Resp: clear to auscultation bilaterally Cardio: regular rate and rhythm, S1, S2 normal, no murmur, click, rub or gallop GI: abnormal findings:  Palpatory tenderness at the epigastrium Extremities: extremities normal, atraumatic, no cyanosis or edema Neurologic: Alert and oriented X 3, normal strength and tone. Normal symmetric reflexes. Normal coordination and gait Skin:  No active lesions or rashes noted.    Psych:  No vegetative signs or delusional behaviors noted.    Laboratory: From 10/20/2010 ,  CD4 count was 50 c/cmm @ , unknown %. Viral load from 08/09/2010 was 106,000 copies./ML.     Assessment/Plan:   HIV (human immunodeficiency virus  infection) He has demonstrated, now, a pro active. Readiness to start HIV. Therapy. After careful review of multiple regimens, we have agreed upon Isentress, plus, Truvada. Treatment regimen, restrictions, and limitations, side effects, ADR is, and followup were discussed in explicit detail. Written instructions on the treatment regimen. Were provided for him as well as instructions on laboratory, and clinical followup. He verbally acknowledged all information that was provided to him and agreed with plan of care. The importance of adherence was discussed in detail as well. HIV transmission, and HIV prevention were also reviewed with him during this visit. We will ask him to schedule a followup visit in 6 weeks with labs 2 weeks before the. Next appointment.     Pax Reasoner A. Sundra Aland, MS, [facility] for Infectious Disease [phone]  03/07/2011, 11:41 AM

## 2011-03-07 NOTE — Progress Notes (Signed)
Patient ID: Francis Pacheco, male   DOB: May 23, 1965, 46 y.o.   MRN: [id number]  INFECTIOUS DISEASE PROGRESS NOTE    Subjective: Francis Pacheco is in for his routine visit. He is taking his medications correctly and does not recall missing a single dose. He feels his pillbox each Sunday. He can pick his medications off of the chart and describes taking each correctly. He states he's feeling better than he has since he was in high school playing sports. He is now working out at the gym 2 times each week.  Objective:   General: He is healthy and in good spirits Skin: No rash Lungs: Clear Cor: Regular S1 and S2 and no murmurs   Lab Results Lab Results  Component Value Date   WBC 3.8* 02/22/2011   HGB 13.9 02/22/2011   HCT 40.3 02/22/2011   MCV 88.6 02/22/2011   PLT 184 02/22/2011    Lab Results  Component Value Date   CREATININE 1.02 02/22/2011   BUN 13 02/22/2011   NA 136 02/22/2011   K 4.4 02/22/2011   CL 100 02/22/2011   CO2 25 02/22/2011    Lab Results  Component Value Date   ALT 21 02/22/2011   AST 27 02/22/2011   ALKPHOS 84 02/22/2011   BILITOT 0.6 02/22/2011      HIV 1 RNA Quant (copies/mL)  Date Value  02/22/2011 <20   11/22/2010 113*  08/09/2010 109000*     CD4 T Cell Abs (cmm)  Date Value  02/22/2011 140*  11/22/2010 120*  08/09/2010 40*   Assessment: His HIV viral load is now undetectable and his CD4 count is slowly reconstituted to 140. His appearance is excellent.  Plan: 1. Continue current regimen 2. Discontinue fluconazole and Zithromax 3. Return to clinic after lab work in 3 months   Cliffton Asters, MD [facility] for Infectious Diseases [facility] [phone] pager   [phone] cell 03/07/2011, 10:48 AM

## 2011-03-07 NOTE — Assessment & Plan Note (Signed)
He has demonstrated, now, a pro active. Readiness to start HIV. Therapy. After careful review of multiple regimens, we have agreed upon Isentress, plus, Truvada. Treatment regimen, restrictions, and limitations, side effects, ADR is, and followup were discussed in explicit detail. Written instructions on the treatment regimen. Were provided for him as well as instructions on laboratory, and clinical followup. He verbally acknowledged all information that was provided to him and agreed with plan of care. The importance of adherence was discussed in detail as well. HIV transmission, and HIV prevention were also reviewed with him during this visit. We will ask him to schedule a followup visit in 6 weeks with labs 2 weeks before the. Next appointment.

## 2011-03-21 NOTE — Assessment & Plan Note (Signed)
Ketoconazole 2% cream to affected area twice a day. If that does not work. Will try Nizoral shampoo

## 2011-03-21 NOTE — Progress Notes (Signed)
New Patient Note:   Subjective:    Patient ID: Francis Pacheco is a 46 y.o. male.  Chief Complaint: HIV Initial Visit Khi Mcmillen Newly diagnosed HIV patientis here for initial HIV evaluation by [facility].  He was tested for HIV on 07/01/2010.   The reason for the }test was chronic dysphagia.   His transmission risk factors include heterosexual contact.       Thereare additional complaints. Complains of continued dysphagia, which is being evaluated by GI services. Also, complains of a rash on his scalp, face, and groin. Dental pain, oral pain, seemed to be improving.    Past Medical History  Diagnosis Date  . GERD (gastroesophageal reflux disease)   . HIV disease   . Ulcer     Past Surgical History  Procedure Date  . Multiple tooth extractions     Partial  . Hospitalization 06/2009    tree accident - fx 1st rib, left mid clavicle and scapular fx, left transverse process of C7-T2  . Colonoscopy   . Upper gastrointestinal endoscopy     Family History  Problem Relation Age of Onset  . Heart attack Father 50  . Colon cancer Neg Hx     Social History:  reports that he has never smoked. He has never used smokeless tobacco. He reports that he does not drink alcohol or use illicit drugs.  Allergies: No Known Allergies  Medications Prior to Admission  Medication Sig Dispense Refill  . chlorhexidine (PERIDEX) 0.12 % solution Use as directed 15 mLs in the mouth or throat 2 (two) times daily.  480 mL  2  . fluconazole (DIFLUCAN) 100 MG tablet Take 1 tablet (100 mg total) by mouth daily.  10 tablet  0   No current facility-administered medications on file as of 08/17/2010.      Data Review: Diagnostic studies reviewed.  a baseline CD4 absolute count of 40, CD4% count of 4, and a viral load of 109,000.   Review of Systems - General ROS: negative for - chills, fatigue, fever, hot flashes, malaise, night sweats, sleep disturbance or weight loss Psychological ROS: negative  for - anxiety, behavioral disorder, concentration difficulties, decreased libido, disorientation, memory difficulties, mood swings, physical abuse, sleep disturbances or suicidal ideation Ophthalmic ROS: positive for - blurry vision, decreased vision, double vision and loss of vision ENT ROS: positive for - oral lesions and dysphagia Allergy and Immunology ROS: negative for - hives, latex, nasal congestion or postnasal drip Hematological and Lymphatic ROS: negative for - bleeding problems, blood clots, blood transfusions, bruising or weight loss Endocrine ROS: negative Respiratory ROS: no cough, shortness of breath, or wheezing Cardiovascular ROS: no chest pain or dyspnea on exertion Gastrointestinal ROS: Chronic GERD-like symptoms associated with his dysphagia Genito-Urinary ROS: no dysuria, trouble voiding, or hematuria Musculoskeletal ROS: negative Neurological ROS: no TIA or stroke symptoms negative for - behavioral changes, bowel and bladder control changes, confusion, dizziness, gait disturbance, headaches, impaired coordination/balance, memory loss, numbness/tingling, speech problems, visual changes or weakness Dermatological ROS: positive for pruritus and rash  Objective:  General appearance: alert and cooperative Head: atraumatic, Patchy hyperkeratotic rash noted. Scalp Eyes: conjunctivae/corneas clear. PERRL, EOM's intact. Fundi benign. Ears: normal TM's and external ear canals both ears Nose: Nares normal. Septum midline. Mucosa normal. No drainage or sinus tenderness. Throat: Poor dentition, but no active thrush noted Neck: no adenopathy, no carotid bruit, no JVD, supple, symmetrical, trachea midline and thyroid not enlarged, symmetric, no tenderness/mass/nodules Resp: clear to auscultation bilaterally Cardio: regular  rate and rhythm, S1, S2 normal, no murmur, click, rub or gallop GI: Some palpatory epigastric tenderness noted, bowel sounds active x4, no hepatosplenomegaly. Male  genitalia: penis: no lesions or discharge. testes: no masses or tenderness. no hernias, Bilateral inguinal rash noted. Hyperpigmented with distinct borders Extremities: extremities normal, atraumatic, no cyanosis or edema Pulses: 2+ and symmetric Skin: Facial seborrhea noted Lymph nodes: Cervical adenopathy: Bilateral and shotty, Axillary adenopathy: Bilateral and shotty and Inguinal adenopathy: Bilateral and shotty Neurologic: Alert and oriented X 3, normal strength and tone. Normal symmetric reflexes. Normal coordination and gait Psych:  No vegetative signs or delusional behaviors noted.    Laboratory: Is identified in history of present illness   Assessment/Plan:   HIV (human immunodeficiency virus infection) A CD4 count 40 at 4% is significant advanced HIV disease. However, he is reluctant to accept his diagnosis and feels that his faith in religion will help control the virus and his immune system. We spent a considerable period of time discussing the need for treatment for his HIV, but he wants to have time to prove otherwise. Additionally, we did recommend that we try to give him some prophylaxis to assist him in maintaining some good health for which he was willing to take. We, therefore, gave him Bactrim for PCP prophylaxis and azithromycin for MAC prophylaxis. We will have him return to clinic in 2 weeks for followup. When hopefully, the, genotype will be available and he is more amenable to treatment.  Tinea cruris He states he has his own steroid cream, which he uses on his rashes. Therefore, we will give him ketoconazole 2% cream to applied simultaneously to the, rashes. He was willing to try that therapy. Strategy.  Tinea capitis Ketoconazole 2% cream to affected area twice a day. If that does not work. Will try Nizoral shampoo  Seborrhea Begin combination of steroid creams, and the Nizoral cream to the facial area twice a day  Periodontal disease To continue with the Peridex  mouthwash, and complete fluconazole therapy.  Dysphagia To see GI for an upper endoscopy to further determine problems, associated with the, dysphagia     Evellyn Tuff A. Sundra Aland, MS, [facility]ious Disease [phone]

## 2011-03-21 NOTE — Assessment & Plan Note (Signed)
To see GI for an upper endoscopy to further determine problems, associated with the, dysphagia

## 2011-03-21 NOTE — Assessment & Plan Note (Signed)
To continue with the Peridex mouthwash, and complete fluconazole therapy.

## 2011-03-21 NOTE — Assessment & Plan Note (Signed)
He states he has his own steroid cream, which he uses on his rashes. Therefore, we will give him ketoconazole 2% cream to applied simultaneously to the, rashes. He was willing to try that therapy. Strategy.

## 2011-03-21 NOTE — Assessment & Plan Note (Signed)
Begin combination of steroid creams, and the Nizoral cream to the facial area twice a day

## 2011-03-21 NOTE — Assessment & Plan Note (Signed)
A CD4 count 40 at 4% is significant advanced HIV disease. However, he is reluctant to accept his diagnosis and feels that his faith in religion will help control the virus and his immune system. We spent a considerable period of time discussing the need for treatment for his HIV, but he wants to have time to prove otherwise. Additionally, we did recommend that we try to give him some prophylaxis to assist him in maintaining some good health for which he was willing to take. We, therefore, gave him Bactrim for PCP prophylaxis and azithromycin for MAC prophylaxis. We will have him return to clinic in 2 weeks for followup. When hopefully, the, genotype will be available and he is more amenable to treatment.

## 2011-05-22 ENCOUNTER — Other Ambulatory Visit: Payer: BC Managed Care – PPO

## 2011-05-22 DIAGNOSIS — B20 Human immunodeficiency virus [HIV] disease: Secondary | ICD-10-CM

## 2011-05-24 LAB — HIV-1 RNA QUANT-NO REFLEX-BLD: HIV 1 RNA Quant: <20 copies/mL (ref <20)

## 2011-06-05 ENCOUNTER — Ambulatory Visit (INDEPENDENT_AMBULATORY_CARE_PROVIDER_SITE_OTHER): Payer: BC Managed Care – PPO | Admitting: Internal Medicine

## 2011-06-05 ENCOUNTER — Encounter: Payer: Self-pay | Admitting: Internal Medicine

## 2011-06-05 VITALS — BP 140/92 | HR 74 | Temp 98.0°F | Ht 73.0 in | Wt 221.2 lb

## 2011-06-05 DIAGNOSIS — B20 Human immunodeficiency virus [HIV] disease: Secondary | ICD-10-CM

## 2011-06-05 DIAGNOSIS — Z21 Asymptomatic human immunodeficiency virus [HIV] infection status: Secondary | ICD-10-CM

## 2011-06-05 NOTE — Progress Notes (Signed)
Patient ID: Francis Pacheco, male   DOB: [birth year], 46 y.o.   MRN: [id number]  INFECTIOUS DISEASE PROGRESS NOTE    Subjective: Yandel is in for his routine visit. He denies missing any of his Truvada or Isentress. He is doing well without any current complaints.  Objective: Temp: 98 F (36.7 C) (04/29 1454) Temp src: Oral (04/29 1454) BP: 140/92 mmHg (04/29 1454) Pulse Rate: 74  (04/29 1454)  General: He appears healthy but his blood pressure is up and his BMI is just over 29 Skin: No rash Lungs: Clear Cor: Regular S1 and S2 with no murmurs Abdomen: Soft and nontender  Lab Results HIV 1 RNA Quant (copies/mL)  Date Value  05/22/2011 <20   02/22/2011 <20   11/22/2010 113*     CD4 T Cell Abs (cmm)  Date Value  05/22/2011 160*  02/22/2011 140*  11/22/2010 120*     Assessment: His HIV is well controlled and his CD4 is reconstituting. I will continue his current antiretroviral regimen.  He has mild hypertension and his BMI is too high. I talked to him about lifestyle modification and salt reduction. He would like to try that and shoot for some weight loss before starting antihypertensive therapy.  Plan: 1. Continue Truvada and Isentress 2. Lifestyle modification 3. Return after lab work in 6 months   Cliffton Asters, MD [facility] for Infectious Diseases [facility] [phone] pager   [phone] cell 06/05/2011, 3:17 PM

## 2011-10-20 ENCOUNTER — Other Ambulatory Visit: Payer: Self-pay | Admitting: *Deleted

## 2011-10-20 DIAGNOSIS — B20 Human immunodeficiency virus [HIV] disease: Secondary | ICD-10-CM

## 2011-10-20 MED ORDER — RALTEGRAVIR POTASSIUM 400 MG PO TABS: 400.0000 mg | ORAL_TABLET | Freq: Two times a day (BID) | ORAL | Status: DC

## 2011-10-20 MED ORDER — EMTRICITABINE-TENOFOVIR DF 200-300 MG PO TABS: 1.0000 | ORAL_TABLET | Freq: Every day | ORAL | Status: DC

## 2011-11-20 ENCOUNTER — Other Ambulatory Visit: Payer: BC Managed Care – PPO

## 2011-12-04 ENCOUNTER — Ambulatory Visit: Payer: BC Managed Care – PPO | Admitting: Internal Medicine

## 2011-12-11 ENCOUNTER — Other Ambulatory Visit: Payer: BC Managed Care – PPO

## 2011-12-11 DIAGNOSIS — B20 Human immunodeficiency virus [HIV] disease: Secondary | ICD-10-CM

## 2011-12-12 LAB — HIV-1 RNA QUANT-NO REFLEX-BLD
HIV 1 RNA Quant: <20 copies/mL (ref <20)
HIV-1 RNA Quant, Log: <1.3 {Log} (ref <1.30)

## 2011-12-12 LAB — T-HELPER CELL (CD4) - (RCID CLINIC ONLY): CD4 % Helper T Cell: 12 % — ABNORMAL LOW (ref 33–55)

## 2011-12-24 ENCOUNTER — Ambulatory Visit (INDEPENDENT_AMBULATORY_CARE_PROVIDER_SITE_OTHER): Payer: Managed Care, Other (non HMO) | Admitting: Family Medicine

## 2011-12-24 VITALS — BP 100/65 | HR 80 | Temp 98.1°F | Resp 16 | Ht 73.0 in | Wt 224.0 lb

## 2011-12-24 DIAGNOSIS — M79643 Pain in unspecified hand: Secondary | ICD-10-CM

## 2011-12-24 DIAGNOSIS — IMO0001 Reserved for inherently not codable concepts without codable children: Secondary | ICD-10-CM

## 2011-12-24 DIAGNOSIS — M79609 Pain in unspecified limb: Secondary | ICD-10-CM

## 2011-12-24 DIAGNOSIS — L03019 Cellulitis of unspecified finger: Secondary | ICD-10-CM

## 2011-12-24 MED ORDER — DOXYCYCLINE HYCLATE 100 MG PO TABS: 100.0000 mg | ORAL_TABLET | Freq: Two times a day (BID) | ORAL | Status: DC

## 2011-12-24 NOTE — Progress Notes (Signed)
  Subjective:    Patient ID: Francis Pacheco, male    DOB: January 08, 1966, 46 y.o.   MRN: [id number]  HPI Francis Pacheco is a 46 y.o. male  L 3rd finger pain, swelling around nail. Started with a splinter from wooden trailer 6 days ago. didn't try to remove splinter. Swelling few days later, tried to open with pin to relieve pus 3 days ago - some relief, but more swollen now.  No fever. No ointments. Has not needed any pain medicines.   Hx of HIV.  Last ID visit 6 months ago, unknown CD4 count - but "normal", but undetectable.  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   Review of Systems  Constitutional: Negative for fever and chills.  Skin: Positive for wound. Negative for rash.       Objective:   Physical Exam  Constitutional: He is oriented to person, place, and time. He appears well-developed and well-nourished.  Pulmonary/Chest: Effort normal.  Musculoskeletal:       Hands: Neurological: He is alert and oriented to person, place, and time.  Skin: Skin is warm and dry.  Psychiatric: He has a normal mood and affect. His behavior is normal.       Assessment & Plan:  Francis Pacheco is a 46 y.o. male 1. Paronychia of third finger of left hand  doxycycline (VIBRA-TABS) 100 MG tablet  2. Pain, hand     I and D in office - see procedure note. Possible FB as cause, but not identified on procedure.  Start doxycycline. Tylenol or ibuprofen otc prn - call if stronger med needed. rtc precautions.   Patient Instructions  Tylenol or ibuprofen as needed for pain.  Take the antibiotic twice per day. Keep follow up with your infectious disease doctor tomorrow. We did not identify a foreign body in the area involved, but this is still possible.  Work on soaks of finger as discussed, and if not improving in next 2-3 days - recheck for possible further evaluation of area. Return to the clinic or go to the nearest emergency room if any of your symptoms worsen or new symptoms occur.

## 2011-12-24 NOTE — Patient Instructions (Addendum)
Tylenol or ibuprofen as needed for pain.  Take the antibiotic twice per day. Keep follow up with your infectious disease doctor tomorrow. We did not identify a foreign body in the area involved, but this is still possible.  Work on soaks of finger as discussed, and if not improving in next 2-3 days - recheck for possible further evaluation of area. Return to the clinic or go to the nearest emergency room if any of your symptoms worsen or new symptoms occur.

## 2011-12-24 NOTE — Progress Notes (Signed)
Patient ID: Francis Pacheco MRN: [id number], DOB: Jun 18, 1965, 46 y.o. Date of Encounter: 12/24/2011, 9:03 AM    PROCEDURE NOTE: Verbal consent obtained. Betadine prep per usual protocol. Digital block with 2% lidocaine plain.  1 cm incision made with 11 blade along lesion.  Culture taken. copious purulence expressed. Lesion explored revealing no loculations or foreign bodies.  Dressed. Wound care instructions including precautions with patient. Patient tolerated the procedure well.     Grier Mitts, PA-C 12/24/2011 9:03 AM

## 2011-12-25 ENCOUNTER — Ambulatory Visit: Payer: BC Managed Care – PPO | Admitting: Internal Medicine

## 2011-12-25 ENCOUNTER — Encounter: Payer: Self-pay | Admitting: Internal Medicine

## 2011-12-25 ENCOUNTER — Ambulatory Visit (INDEPENDENT_AMBULATORY_CARE_PROVIDER_SITE_OTHER): Payer: BC Managed Care – PPO | Admitting: Internal Medicine

## 2011-12-25 VITALS — BP 150/102 | HR 80 | Temp 97.9°F | Ht 73.0 in | Wt 221.2 lb

## 2011-12-25 DIAGNOSIS — Z21 Asymptomatic human immunodeficiency virus [HIV] infection status: Secondary | ICD-10-CM

## 2011-12-25 DIAGNOSIS — Z23 Encounter for immunization: Secondary | ICD-10-CM

## 2011-12-25 DIAGNOSIS — B20 Human immunodeficiency virus [HIV] disease: Secondary | ICD-10-CM

## 2011-12-25 NOTE — Progress Notes (Signed)
Patient ID: Francis Pacheco, male   DOB: [birth year], 46 y.o.   MRN: [id number]     [facility] for Infectious Disease  Patient Active Problem List  Diagnosis  . VENEREAL WART  . RECTAL BLEEDING  . Screening for venereal disease  . Periodontal disease  . HIV (human immunodeficiency virus infection)  . Tinea cruris  . Tinea capitis  . Seborrhea  . Esophageal ulcer    Patient's Medications  New Prescriptions   No medications on file  Previous Medications   DOXYCYCLINE (VIBRA-TABS) 100 MG TABLET    Take 1 tablet (100 mg total) by mouth 2 (two) times daily.   EMTRICITABINE-TENOFOVIR (TRUVADA) 200-300 MG PER TABLET    Take 1 tablet by mouth daily.   IBUPROFEN (ADVIL,MOTRIN) 100 MG TABLET    Take 100 mg by mouth every 6 (six) hours as needed.   RALTEGRAVIR (ISENTRESS) 400 MG TABLET    Take 1 tablet (400 mg total) by mouth 2 (two) times daily.  Modified Medications   No medications on file  Discontinued Medications   No medications on file    Subjective: Francis Pacheco is in for his routine visit. He has not missed any doses of his Truvada or Isentress since his last visit. He did develop a paronychia on his right long finger and underwent incision and drainage by Dr. Fredna Dow yesterday. He started on oral doxycycline and is feeling much better. Gram stain shows gram-positive cocci in clusters.  Objective: Temp: 97.9 F (36.6 C) (11/18 1541) Temp src: Oral (11/18 1541) BP: 150/102 mmHg (11/18 1541) Pulse Rate: 80  (11/18 1541)  General: He is in good spirits as usual Skin: He has a Band-Aid over the tip of his right long finger Lungs: Clear Cor: Regular S1 and S2 no murmurs  Lab Results HIV 1 RNA Quant (copies/mL)  Date Value  12/11/2011 <20   05/22/2011 <20   02/22/2011 <20      CD4 T Cell Abs (cmm)  Date Value  12/11/2011 210*  05/22/2011 160*  02/22/2011 140*     Assessment: His HIV infection has come under much better control over the last 15 months since starting  antiretroviral therapy. I will continue his current regimen.   I agree with continuing doxycycline pending culture results.  His blood pressure is elevated today. I have encouraged him to establish primary care.  Plan: 1. Continue current antiretroviral therapy 2. Influenza vaccination today 3. Followup culture 4. Follow up here after blood work in 6 months   Cliffton Asters, MD [facility] for Infectious Disease [facility] [phone] pager   [phone] cell 12/25/2011, 3:55 PM

## 2011-12-27 LAB — WOUND CULTURE: Gram Stain: NONE SEEN

## 2012-01-08 ENCOUNTER — Telehealth: Payer: Self-pay | Admitting: Licensed Clinical Social Worker

## 2012-01-08 NOTE — Telephone Encounter (Signed)
Patient called stating that he was getting a divorce and wanted to remarry and wanted to know if his viral load and cd4 count was good enough that he would not spread the infection to his new partner. I explained to him although he is undetectable there are still risks of spreading the infection if he was not using condoms.  He also had concerns about someone spreading to everyone that he was HIV positive, he wanted to know if that was illegal. I explained to him that the only way he can avoid someone spreading his information is to keep it to himself. He agreed. I also offered him free condoms in the clinic and advised him that he will need to share his status with his partner especially if he is having intercourse with her. Patient was a little agitated by that but agreed he understood I had to tell him that because I worked here. I explained to him it was against the law to sleep with someone and  not disclose his status. He said ok and ended the conversation.

## 2012-01-16 ENCOUNTER — Telehealth: Payer: Self-pay | Admitting: *Deleted

## 2012-01-16 NOTE — Telephone Encounter (Signed)
Patient walked into the clinic today with a woman he advised is his fiance. He requested a copy of his lab work and wanted me to tell her what his numbers mean advised patient I can not explain anything to the fiance but offered to make him an appt to sit down with the doctor or the intake nurse to explain or answer any questions. The patient insisted I talk to the fiance and before I could answer she spoke. She advised she only wants to know one thing and that is"Where she can be tested and how long it would take to get her results." She also advised that she and the patient had sex several times and did not tell her he was positive until today. She was very upset and crying. I excused myself to get some information on where and when she could be tested. After a short time I went back into the room and told her she could be tested at [facility] as a walk in and gave her the address.

## 2012-04-15 ENCOUNTER — Other Ambulatory Visit: Payer: Self-pay | Admitting: Internal Medicine

## 2012-04-15 DIAGNOSIS — B20 Human immunodeficiency virus [HIV] disease: Secondary | ICD-10-CM

## 2012-04-30 ENCOUNTER — Ambulatory Visit (INDEPENDENT_AMBULATORY_CARE_PROVIDER_SITE_OTHER): Payer: BC Managed Care – PPO | Admitting: Family Medicine

## 2012-04-30 VITALS — BP 118/80 | HR 80 | Temp 98.4°F | Resp 16 | Ht 74.8 in | Wt 225.2 lb

## 2012-04-30 DIAGNOSIS — L739 Follicular disorder, unspecified: Secondary | ICD-10-CM

## 2012-04-30 DIAGNOSIS — L738 Other specified follicular disorders: Secondary | ICD-10-CM

## 2012-04-30 MED ORDER — DOXYCYCLINE HYCLATE 100 MG PO TABS: 100.0000 mg | ORAL_TABLET | Freq: Two times a day (BID) | ORAL | Status: DC

## 2012-04-30 NOTE — Patient Instructions (Addendum)
Begin taking the doxycycline as directed.  Be sure to finish the full course.  I will let you know when the culture comes back and if we need to make any changes.  If this is not improving, or certainly if it's worsening, please let us or your ID doctor know.   Folliculitis  Folliculitis is redness, soreness, and swelling (inflammation) of the hair follicles. This condition can occur anywhere on the body. People with weakened immune systems, diabetes, or obesity have a greater risk of getting folliculitis. CAUSES  Bacterial infection. This is the most common cause.  Fungal infection.  Viral infection.  Contact with certain chemicals, especially oils and tars. Long-term folliculitis can result from bacteria that live in the nostrils. The bacteria may trigger multiple outbreaks of folliculitis over time. SYMPTOMS Folliculitis most commonly occurs on the scalp, thighs, legs, back, buttocks, and areas where hair is shaved frequently. An early sign of folliculitis is a small, white or yellow, pus-filled, itchy lesion (pustule). These lesions appear on a red, inflamed follicle. They are usually less than 0.2 inches (5 mm) wide. When there is an infection of the follicle that goes deeper, it becomes a boil or furuncle. A group of closely packed boils creates a larger lesion (carbuncle). Carbuncles tend to occur in hairy, sweaty areas of the body. DIAGNOSIS  Your caregiver can usually tell what is wrong by doing a physical exam. A sample may be taken from one of the lesions and tested in a lab. This can help determine what is causing your folliculitis. TREATMENT  Treatment may include:  Applying warm compresses to the affected areas.  Taking antibiotic medicines orally or applying them to the skin.  Draining the lesions if they contain a large amount of pus or fluid.  Laser hair removal for cases of long-lasting folliculitis. This helps to prevent regrowth of the hair. HOME CARE  INSTRUCTIONS  Apply warm compresses to the affected areas as directed by your caregiver.  If antibiotics are prescribed, take them as directed. Finish them even if you start to feel better.  You may take over-the-counter medicines to relieve itching.  Do not shave irritated skin.  Follow up with your caregiver as directed. SEEK IMMEDIATE MEDICAL CARE IF:   You have increasing redness, swelling, or pain in the affected area.  You have a fever. MAKE SURE YOU:  Understand these instructions.  Will watch your condition.  Will get help right away if you are not doing well or get worse. Document Released: 04/03/2001 Document Revised: 07/25/2011 Document Reviewed: 04/25/2011 [facility] Patient Information 2013 [place], [place].

## 2012-04-30 NOTE — Progress Notes (Signed)
Subjective: Was asked to see the patient with Francis Pacheco.  He has little follicles on his scan, the concern about the folliculitis also blends with the fact that he has HIV.  Objective: Small pustular folliculitis scattered on his trunk and extremities.  Assessment: Folliculitis History of HIV  Plan: Per discussion with PA I think one of the should be unroofed and cultured, and treatment initiated with doxycycline. Cautioned the patient that if the patient places her getting all worse he needs to return or see his HIV physician

## 2012-04-30 NOTE — Progress Notes (Signed)
  Subjective:    Patient ID: Francis Pacheco, male    DOB: 16-Apr-1965, 47 y.o.   MRN: [id number]  HPI   Francis Pacheco is a pleasant 47 yr old male here with concern for allergic reaction.  He has noted a rash on his arms and abdomen for about 2 weeks.  Denies itching or pain.  Occasionally a small amount of pus drains from the lesions.  He denies fever, chills or any other associated symptoms.  He has never had anything like this before.  No one at home or at work has similar symptoms.  Thinks perhaps he came into contact with something he is allergic to, but has no new exposures to soaps, detergents, foods, medications at the time the rash developed.  He has no known allergies.  Has not used anything for the rash aside from moisturizer, which he feels may have helped somewhat.    Of note, pt is HIV+.  He is followed by ID here at [facility].  Treated with Truvada and Isentress for 2 yrs now.     Review of Systems  Constitutional: Negative for fever, chills and fatigue.  HENT: Negative.   Respiratory: Negative.   Cardiovascular: Negative.   Gastrointestinal: Negative.   Musculoskeletal: Negative.   Skin: Positive for rash (arms, abdomen).  Neurological: Negative.   Hematological: Negative.        Objective:   Physical Exam  Vitals reviewed. Constitutional: He is oriented to person, place, and time. He appears well-developed and well-nourished. No distress.  HENT:  Head: Normocephalic and atraumatic.  Eyes: Conjunctivae are normal. No scleral icterus.  Cardiovascular: Normal rate, regular rhythm and normal heart sounds.   Pulmonary/Chest: Effort normal and breath sounds normal. He has no wheezes. He has no rales.  Neurological: He is alert and oriented to person, place, and time.  Skin: Skin is warm and dry.     Multiple small lesions on bilateral arms and abdomen in various stages of healing - some pustules, some scabbed, some healed; no vesicles, minimal erythema; no involvement of palms,  soles, or oral mucosa; no drainage, crusting, or scaling  Psychiatric: He has a normal mood and affect. His behavior is normal.    Filed Vitals:   04/30/12 0827  BP: 118/80  Pulse: 80  Temp: 98.4 F (36.9 C)  Resp: 16    CD4 12/11/11:  211 Viral load 12/11/11: <20      Assessment & Plan:  Folliculitis - Plan: Wound culture, doxycycline (VIBRA-TABS) 100 MG tablet  Francis Pacheco is a pleasant 48 yr old male here with a rash that appears consistent with folliculitis.  He has no associated systemic symptoms.  No involvement of palms, soles, or oral mucosa.  Several lesions were unroofed and a culture was collected.  Certainly with his HIV disease the differential broadens, but will treat as folliculitis with doxycycline x 10 days.  If symptoms persist or worsen, pt will RTC or follow up with ID.    Pt examined and discussed with Dr. Alwyn Ren.

## 2012-05-03 LAB — WOUND CULTURE
Gram Stain: NONE SEEN
Gram Stain: NONE SEEN

## 2012-05-13 ENCOUNTER — Other Ambulatory Visit: Payer: Self-pay | Admitting: Internal Medicine

## 2012-06-14 ENCOUNTER — Other Ambulatory Visit: Payer: Self-pay | Admitting: *Deleted

## 2012-06-14 DIAGNOSIS — B20 Human immunodeficiency virus [HIV] disease: Secondary | ICD-10-CM

## 2012-06-14 MED ORDER — RALTEGRAVIR POTASSIUM 400 MG PO TABS: 400.0000 mg | ORAL_TABLET | Freq: Two times a day (BID) | ORAL | Status: DC

## 2012-06-14 NOTE — Telephone Encounter (Signed)
error 

## 2012-06-24 ENCOUNTER — Other Ambulatory Visit: Payer: BC Managed Care – PPO

## 2012-07-08 ENCOUNTER — Ambulatory Visit (INDEPENDENT_AMBULATORY_CARE_PROVIDER_SITE_OTHER): Payer: BC Managed Care – PPO | Admitting: Internal Medicine

## 2012-07-08 VITALS — BP 160/91 | HR 90 | Temp 97.8°F | Resp 18 | Ht 73.0 in | Wt 224.0 lb

## 2012-07-08 DIAGNOSIS — Z21 Asymptomatic human immunodeficiency virus [HIV] infection status: Secondary | ICD-10-CM

## 2012-07-08 DIAGNOSIS — B20 Human immunodeficiency virus [HIV] disease: Secondary | ICD-10-CM

## 2012-07-08 NOTE — Progress Notes (Signed)
Patient ID: Francis Pacheco, male   DOB: 18-Aug-1965, 47 y.o.   MRN: [id number]          [facility] for Infectious Disease  Patient Active Problem List   Diagnosis Date Noted  . HIV (human immunodeficiency virus infection) 08/17/2010    Priority: High  . Esophageal ulcer 11/21/2010  . Tinea cruris 08/17/2010  . Tinea capitis 08/17/2010  . Seborrhea 08/17/2010  . Periodontal disease 08/09/2010  . Screening for venereal disease 07/25/2010  . VENEREAL WART 01/03/2010  . RECTAL BLEEDING 01/03/2010    Patient's Medications  New Prescriptions   No medications on file  Previous Medications   IBUPROFEN (ADVIL,MOTRIN) 100 MG TABLET    Take 100 mg by mouth every 6 (six) hours as needed.   RALTEGRAVIR (ISENTRESS) 400 MG TABLET    Take 1 tablet (400 mg total) by mouth 2 (two) times daily.   TRUVADA 200-300 MG PER TABLET    TAKE ONE TABLET BY MOUTH EVERY DAY  Modified Medications   No medications on file  Discontinued Medications   DOXYCYCLINE (VIBRA-TABS) 100 MG TABLET    Take 1 tablet (100 mg total) by mouth 2 (two) times daily.    Subjective: Francis Pacheco is in for his routine visit. He has not missed any doses of his Truvada or Isentress since his last visit. He is feeling well and working hard. After his last visit he got remarried. He has been sexually active and uses condoms intermittently. His wife has been tested negative and I have discussed the relative risks and benefits of sexual activity.  Review of Systems: Pertinent items are noted in HPI.  Past Medical History  Diagnosis Date  . GERD (gastroesophageal reflux disease)   . HIV disease   . Ulcer     History  Substance Use Topics  . Smoking status: Never Smoker   . Smokeless tobacco: Never Used  . Alcohol Use: Yes     Comment: occasional    Family History  Problem Relation Age of Onset  . Heart attack Father 9  . Colon cancer Neg Hx     No Known Allergies  Objective: Temp: 97.8 F (36.6 C) (06/02  1546) Temp src: Oral (06/02 1546) BP: 160/91 mmHg (06/02 1546) Pulse Rate: 90 (06/02 1546)  General: He is in very good spirits as usual Oral: No oropharyngeal lesions Skin: No rash Lungs: Clear Cor: Regular S1 and S2 with no murmurs  Lab Results HIV 1 RNA Quant (copies/mL)  Date Value  12/11/2011 <20   05/22/2011 <20   02/22/2011 <20      CD4 T Cell Abs (cmm)  Date Value  12/11/2011 210*  05/22/2011 160*  02/22/2011 140*     Assessment: Francis Pacheco HIV infection has come under much better control since starting antiretroviral therapy 2 years ago. His virus is consistently suppressed and he is having steady CD4 reconstitution. I talked to him about ways to simplify his therapy including a research study which is currently enrolling patients to switch to a once daily 3 drug regimen has a single tablet. He is interested in talking to Deirdre Evener, our research study coordinator.  Plan: 1. Continue Truvada and Isentress but he will speak with our research study coordinator 2. Followup in 6 months 3. I talked to him about potential risks associated with sexual activity and encouraged his wife to come here every 3-6 months for her free partner testing   Cliffton Asters, MD [facility] for Infectious Disease [facility] [phone] pager   [phone] cell 07/08/2012, 4:03 PM

## 2012-07-15 ENCOUNTER — Other Ambulatory Visit: Payer: BC Managed Care – PPO

## 2012-07-16 ENCOUNTER — Ambulatory Visit: Payer: BC Managed Care – PPO | Admitting: Internal Medicine

## 2012-07-16 ENCOUNTER — Other Ambulatory Visit: Payer: BC Managed Care – PPO

## 2012-07-16 ENCOUNTER — Other Ambulatory Visit: Payer: Self-pay | Admitting: *Deleted

## 2012-07-16 DIAGNOSIS — B20 Human immunodeficiency virus [HIV] disease: Secondary | ICD-10-CM

## 2012-07-16 LAB — CBC
MCV: 87.3 fL (ref 78.0–100.0)
Platelets: 188 10*3/uL (ref 150–400)
RDW: 13.7 % (ref 11.5–15.5)
WBC: 4 10*3/uL (ref 4.0–10.5)

## 2012-07-16 MED ORDER — EMTRICITABINE-TENOFOVIR DF 200-300 MG PO TABS: ORAL_TABLET | ORAL | Status: DC

## 2012-07-16 MED ORDER — RALTEGRAVIR POTASSIUM 400 MG PO TABS: 400.0000 mg | ORAL_TABLET | Freq: Two times a day (BID) | ORAL | Status: DC

## 2012-07-17 LAB — COMPREHENSIVE METABOLIC PANEL
ALT: 21 U/L (ref 0–53)
AST: 17 U/L (ref 0–37)
Calcium: 9.4 mg/dL (ref 8.4–10.5)
Chloride: 104 mEq/L (ref 96–112)
Creat: 1.08 mg/dL (ref 0.50–1.35)
Total Bilirubin: 0.7 mg/dL (ref 0.3–1.2)

## 2012-07-17 LAB — RPR TITER: RPR Titer: 1:2 {titer}

## 2012-07-17 LAB — LIPID PANEL
Total CHOL/HDL Ratio: 2.8 Ratio
VLDL: 12 mg/dL (ref 0–40)

## 2012-07-17 LAB — RPR: RPR Ser Ql: REACTIVE — AB

## 2012-07-17 LAB — T-HELPER CELL (CD4) - (RCID CLINIC ONLY): CD4 % Helper T Cell: 14 % — ABNORMAL LOW (ref 33–55)

## 2012-07-31 ENCOUNTER — Ambulatory Visit: Payer: BC Managed Care – PPO | Admitting: Internal Medicine

## 2012-08-05 ENCOUNTER — Encounter: Payer: Self-pay | Admitting: *Deleted

## 2012-08-05 ENCOUNTER — Ambulatory Visit (INDEPENDENT_AMBULATORY_CARE_PROVIDER_SITE_OTHER): Payer: BC Managed Care – PPO | Admitting: *Deleted

## 2012-08-05 VITALS — BP 124/80 | HR 60 | Temp 97.7°F | Resp 14 | Ht 73.0 in | Wt 220.2 lb

## 2012-08-05 DIAGNOSIS — Z21 Asymptomatic human immunodeficiency virus [HIV] infection status: Secondary | ICD-10-CM

## 2012-08-05 DIAGNOSIS — B20 Human immunodeficiency virus [HIV] disease: Secondary | ICD-10-CM

## 2012-08-05 NOTE — Progress Notes (Signed)
Pt here for [facility] Screening Visit. Informed consent discussed, questions answered, and he verbalized understanding. He signed consent form witnessed by me. Medical History discussed. He denies any signs or symptoms. Only medication he currently takes are his ARV medications. Vital signs are stable. Non-fasting blood drawn with no problems. EKG completed with QTC = 371 ms using Bazett's formula. I informed him screening will take about 10 days for all lab results to populate and I would call him when I find out if he is eligible/ineligible for study. He received $50.00 gift card for the study visit. Tacey Heap RN

## 2012-08-06 ENCOUNTER — Encounter: Payer: Self-pay | Admitting: *Deleted

## 2012-08-14 LAB — CD4/CD8 (T-HELPER/T-SUPPRESSOR CELL): CD4: 237

## 2012-08-14 LAB — HIV-1 RNA QUANT-NO REFLEX-BLD: HIV-1 RNA Viral Load: <50

## 2012-08-15 NOTE — Addendum Note (Signed)
Addended by: Jennet Maduro D on: 08/15/2012 03:26 PM   Modules accepted: Orders

## 2012-12-16 ENCOUNTER — Other Ambulatory Visit: Payer: BC Managed Care – PPO

## 2012-12-30 ENCOUNTER — Ambulatory Visit: Payer: BC Managed Care – PPO | Admitting: Internal Medicine

## 2012-12-30 ENCOUNTER — Telehealth: Payer: Self-pay | Admitting: *Deleted

## 2012-12-30 ENCOUNTER — Encounter: Payer: Self-pay | Admitting: Internal Medicine

## 2012-12-30 NOTE — Telephone Encounter (Signed)
Requested pt call [facility] to make a new appt. 

## 2013-02-05 ENCOUNTER — Encounter: Payer: Self-pay | Admitting: *Deleted

## 2013-02-05 ENCOUNTER — Telehealth: Payer: Self-pay | Admitting: *Deleted

## 2013-02-05 NOTE — Telephone Encounter (Signed)
No working phone number.  Will send to pt a letter.

## 2013-02-17 IMAGING — NM NM HEPATO W/GB/PHARM/[PERSON_NAME]
1 series · 12 of 12 positions shown · non-contrast
Comparison: Ultrasound 10/21/2010 and CT 10/20/2010.

CLINICAL DATA: Abdominal pain.

NUCLEAR MEDICINE HEPATOBILIARY IMAGING WITH GALLBLADDER EF
TECHNIQUE: Sequential images of the abdomen were obtained [DATE]
minutes following intravenous administration of
radiopharmaceutical.  After the slow intravenous infusion of
uCg Cholecystokinin, the gallbladder ejection fraction was
determined.
Radiopharmaceutical:  5.3 mCi Rc-IIm Choletec

[Series 1: antr · 4.46mm/px · 2 acquisitions, 12 frames shown]
[im 1/2]
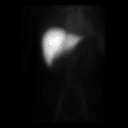
[im 1/2]
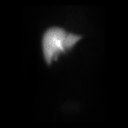
[im 1/2]
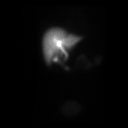
[im 1/2]
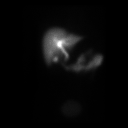
[im 1/2]
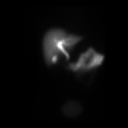
[im 1/2]
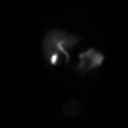
[im 2/2]
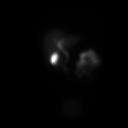
[im 2/2]
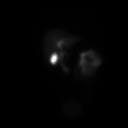
[im 2/2]
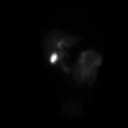
[im 2/2]
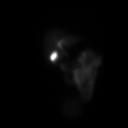
[im 2/2]
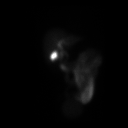
[im 2/2]
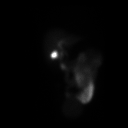

[12 of 12 positions shown; findings below may reference images not displayed]

FINDINGS: Initial images demonstrate homogenous hepatic activity
and prompt visualization of the gallbladder and biliary system.

The stimulated portion of the study demonstrates adequate
gallbladder contraction and progressive small bowel activity.  The
gallbladder ejection fraction calculated at approximately 30
minutes is 42.6%.  Normal ejection fractions are considered greater
than 30%.

The patient did not experience symptoms  during CCK administration.
IMPRESSION: Normal examination.  The cystic and common bile ducts are patent.
Gallbladder ejection fraction is 43%.

## 2013-02-18 ENCOUNTER — Other Ambulatory Visit: Payer: BC Managed Care – PPO

## 2013-02-18 DIAGNOSIS — B20 Human immunodeficiency virus [HIV] disease: Secondary | ICD-10-CM

## 2013-02-18 LAB — CBC
HCT: 40.7 % (ref 39.0–52.0)
Hemoglobin: 14.1 g/dL (ref 13.0–17.0)
MCH: 30.7 pg (ref 26.0–34.0)
MCHC: 34.6 g/dL (ref 30.0–36.0)
MCV: 88.7 fL (ref 78.0–100.0)
PLATELETS: 220 10*3/uL (ref 150–400)
RBC: 4.59 MIL/uL (ref 4.22–5.81)
RDW: 12.9 % (ref 11.5–15.5)
WBC: 3.7 10*3/uL — AB (ref 4.0–10.5)

## 2013-02-18 LAB — COMPREHENSIVE METABOLIC PANEL
ALBUMIN: 4.3 g/dL (ref 3.5–5.2)
ALT: 16 U/L (ref 0–53)
AST: 18 U/L (ref 0–37)
Alkaline Phosphatase: 66 U/L (ref 39–117)
BUN: 15 mg/dL (ref 6–23)
CO2: 27 mEq/L (ref 19–32)
Calcium: 9.3 mg/dL (ref 8.4–10.5)
Chloride: 103 mEq/L (ref 96–112)
Creat: 1.03 mg/dL (ref 0.50–1.35)
GLUCOSE: 88 mg/dL (ref 70–99)
POTASSIUM: 4.2 meq/L (ref 3.5–5.3)
Sodium: 139 mEq/L (ref 135–145)
Total Bilirubin: 0.8 mg/dL (ref 0.3–1.2)
Total Protein: 7.4 g/dL (ref 6.0–8.3)

## 2013-02-19 LAB — HIV-1 RNA QUANT-NO REFLEX-BLD: HIV-1 RNA Quant, Log: <1.3 {Log} (ref <1.30)

## 2013-02-19 LAB — T-HELPER CELL (CD4) - (RCID CLINIC ONLY)
CD4 T CELL ABS: 280 /uL — AB (ref 400–2700)
CD4 T CELL HELPER: 14 % — AB (ref 33–55)

## 2013-03-04 ENCOUNTER — Ambulatory Visit (INDEPENDENT_AMBULATORY_CARE_PROVIDER_SITE_OTHER): Payer: BC Managed Care – PPO | Admitting: Internal Medicine

## 2013-03-04 ENCOUNTER — Encounter: Payer: Self-pay | Admitting: Internal Medicine

## 2013-03-04 VITALS — BP 154/92 | HR 77 | Temp 97.8°F | Ht 73.0 in | Wt 227.8 lb

## 2013-03-04 DIAGNOSIS — Z23 Encounter for immunization: Secondary | ICD-10-CM

## 2013-03-04 DIAGNOSIS — Z21 Asymptomatic human immunodeficiency virus [HIV] infection status: Secondary | ICD-10-CM

## 2013-03-04 DIAGNOSIS — B20 Human immunodeficiency virus [HIV] disease: Secondary | ICD-10-CM

## 2013-03-04 MED ORDER — DOLUTEGRAVIR SODIUM 50 MG PO TABS: 50.0000 mg | ORAL_TABLET | Freq: Every day | ORAL | Status: DC

## 2013-03-04 NOTE — Progress Notes (Signed)
Patient ID: Francis Pacheco, male   DOB: Jul 07, 1965, 48 y.o.   MRN: [id number]          [facility] for Infectious Disease  Patient Active Problem List   Diagnosis Date Noted  . HIV (human immunodeficiency virus infection) 07/25/2010    Priority: High    Class: Diagnosis of  . Esophageal ulcer 11/21/2010    Class: History of  . Seborrhea 08/17/2010    Class: History of  . Periodontal disease 08/09/2010    Class: History of  . VENEREAL WART 01/03/2010    Class: Question of  . RECTAL BLEEDING 01/03/2010    Class: History of    Patient's Medications  New Prescriptions   DOLUTEGRAVIR (TIVICAY) 50 MG TABLET    Take 1 tablet (50 mg total) by mouth daily.  Previous Medications   EMTRICITABINE-TENOFOVIR (TRUVADA) 200-300 MG PER TABLET    TAKE ONE TABLET BY MOUTH EVERY DAY   IBUPROFEN (ADVIL,MOTRIN) 100 MG TABLET    Take 100 mg by mouth every 6 (six) hours as needed.  Modified Medications   No medications on file  Discontinued Medications   RALTEGRAVIR (ISENTRESS) 400 MG TABLET    Take 1 tablet (400 mg total) by mouth 2 (two) times daily.    Subjective: Francis Pacheco is in for his routine visit. He denies missing any doses of his Truvada or Isentress. His sister is with him today. Review of Systems: Pertinent items are noted in HPI.  Past Medical History  Diagnosis Date  . GERD (gastroesophageal reflux disease)   . HIV disease   . Ulcer     History  Substance Use Topics  . Smoking status: Never Smoker   . Smokeless tobacco: Never Used  . Alcohol Use: Yes     Comment: occasional    Family History  Problem Relation Age of Onset  . Heart attack Father 36  . Colon cancer Neg Hx     No Known Allergies  Objective: Temp: 97.8 F (36.6 C) (01/27 0926) Temp src: Oral (01/27 0926) BP: 154/92 mmHg (01/27 0926) Pulse Rate: 77 (01/27 0926)  Body mass index is 30.05 kg/(m^2).  General: He is very cheerful and talkative as usual Oral: No oropharyngeal lesions Skin:  No rash Lungs: Clear Cor: Regular S1 and S2 with no murmurs   Lab Results Lab Results  Component Value Date   WBC 3.7* 02/18/2013   HGB 14.1 02/18/2013   HCT 40.7 02/18/2013   MCV 88.7 02/18/2013   PLT 220 02/18/2013    Lab Results  Component Value Date   CREATININE 1.03 02/18/2013   BUN 15 02/18/2013   NA 139 02/18/2013   K 4.2 02/18/2013   CL 103 02/18/2013   CO2 27 02/18/2013    Lab Results  Component Value Date   ALT 16 02/18/2013   AST 18 02/18/2013   ALKPHOS 66 02/18/2013   BILITOT 0.8 02/18/2013    Lab Results  Component Value Date   CHOL 119 07/16/2012   HDL 42 07/16/2012   LDLCALC 65 07/16/2012   TRIG 59 07/16/2012   CHOLHDL 2.8 07/16/2012    Lab Results HIV 1 RNA Quant (copies/mL)  Date Value  02/18/2013 <20   07/16/2012 <20   12/11/2011 <20      HIV-1 RNA Viral Load (no units)  Date Value  08/05/2012 <50      CD4 T Cell Abs (/uL)  Date Value  02/18/2013 280*  07/16/2012 250*  12/11/2011 210*     Assessment:  His HIV infection is under very good control.  Plan: 1. I will simplify his regimen to Truvada and Tivicay   Francis AstersJohn Ellenore Roscoe, MD [facility] [phone] pager   [phone] cell 03/04/2013, 11:16 AM

## 2013-05-02 ENCOUNTER — Other Ambulatory Visit: Payer: Self-pay | Admitting: *Deleted

## 2013-05-02 DIAGNOSIS — B20 Human immunodeficiency virus [HIV] disease: Secondary | ICD-10-CM

## 2013-05-02 MED ORDER — EMTRICITABINE-TENOFOVIR DF 200-300 MG PO TABS: ORAL_TABLET | ORAL | Status: DC

## 2013-06-30 ENCOUNTER — Other Ambulatory Visit: Payer: Self-pay | Admitting: Internal Medicine

## 2013-07-03 ENCOUNTER — Other Ambulatory Visit: Payer: Self-pay | Admitting: *Deleted

## 2013-07-04 ENCOUNTER — Telehealth: Payer: Self-pay | Admitting: *Deleted

## 2013-07-04 NOTE — Telephone Encounter (Signed)
Patient called stating he no longer has insurance and wanted to come in to pick up some HIV medication. Explained that he will have to sign up for ADAP and Juanell Fairly and he will need to arrange to come in on Monday to apply for this. He said he lost his insurance last week. He did not want to call back on Monday to schedule an appt for ADAP and asked that I give this information to the ADAP coordinator. Will give to [facility] on Monday 07/07/13. Wendall Mola

## 2013-07-07 ENCOUNTER — Telehealth: Payer: Self-pay | Admitting: *Deleted

## 2013-07-07 NOTE — Telephone Encounter (Signed)
Patient called and advised he no longer has insurance. He needs to get a month of his medications as he will get married in July and his new wife will be able to cover him on her insurance. Advised the patient to check and see how long he will have to wait after he is added to [employer] for coverage and make sure he knows that when he comes to see our patient assistance person. Gave the patient's information to [facility]) and advised him she will call him later this morning as she is with someone at this time and let him know what to bring to the appt.

## 2013-07-10 ENCOUNTER — Other Ambulatory Visit: Payer: Self-pay | Admitting: Licensed Clinical Social Worker

## 2013-07-10 DIAGNOSIS — B20 Human immunodeficiency virus [HIV] disease: Secondary | ICD-10-CM

## 2013-07-10 MED ORDER — EMTRICITABINE-TENOFOVIR DF 200-300 MG PO TABS: ORAL_TABLET | ORAL | Status: DC

## 2013-07-10 MED ORDER — DOLUTEGRAVIR SODIUM 50 MG PO TABS: 50.0000 mg | ORAL_TABLET | Freq: Every day | ORAL | Status: DC

## 2013-07-22 ENCOUNTER — Other Ambulatory Visit: Payer: Self-pay | Admitting: *Deleted

## 2013-07-22 DIAGNOSIS — B20 Human immunodeficiency virus [HIV] disease: Secondary | ICD-10-CM

## 2013-07-22 MED ORDER — DOLUTEGRAVIR SODIUM 50 MG PO TABS: 50.0000 mg | ORAL_TABLET | Freq: Every day | ORAL | Status: DC

## 2013-07-22 MED ORDER — EMTRICITABINE-TENOFOVIR DF 200-300 MG PO TABS: ORAL_TABLET | ORAL | Status: DC

## 2013-08-19 ENCOUNTER — Other Ambulatory Visit: Payer: BC Managed Care – PPO

## 2013-08-19 DIAGNOSIS — B20 Human immunodeficiency virus [HIV] disease: Secondary | ICD-10-CM

## 2013-08-20 LAB — LIPID PANEL
Cholesterol: 109 mg/dL (ref 0–200)
HDL: 37 mg/dL — ABNORMAL LOW (ref >39)
LDL CALC: 52 mg/dL (ref 0–99)
TRIGLYCERIDES: 98 mg/dL (ref <150)
Total CHOL/HDL Ratio: 2.9 Ratio
VLDL: 20 mg/dL (ref 0–40)

## 2013-08-20 LAB — T-HELPER CELL (CD4) - (RCID CLINIC ONLY)
CD4 % Helper T Cell: 15 % — ABNORMAL LOW (ref 33–55)
CD4 T CELL ABS: 280 /uL — AB (ref 400–2700)

## 2013-08-21 LAB — HIV-1 RNA QUANT-NO REFLEX-BLD: HIV-1 RNA Quant, Log: <1.3 {Log} (ref <1.30)

## 2013-09-02 ENCOUNTER — Ambulatory Visit (INDEPENDENT_AMBULATORY_CARE_PROVIDER_SITE_OTHER): Payer: BC Managed Care – PPO | Admitting: Internal Medicine

## 2013-09-02 ENCOUNTER — Encounter: Payer: Self-pay | Admitting: Internal Medicine

## 2013-09-02 VITALS — BP 146/81 | HR 73 | Temp 98.3°F | Wt 228.5 lb

## 2013-09-02 DIAGNOSIS — B20 Human immunodeficiency virus [HIV] disease: Secondary | ICD-10-CM

## 2013-09-02 DIAGNOSIS — Z21 Asymptomatic human immunodeficiency virus [HIV] infection status: Secondary | ICD-10-CM

## 2013-09-02 NOTE — Progress Notes (Signed)
Patient ID: Francis Pacheco, male   DOB: 1965/11/12, 48 y.o.   MRN: [id number]          [facility]  Patient Active Problem List   Diagnosis Date Noted  . HIV (human immunodeficiency virus infection) 07/25/2010    Priority: High    Class: Diagnosis of  . Esophageal ulcer 11/21/2010    Class: History of  . Seborrhea 08/17/2010    Class: History of  . Periodontal disease 08/09/2010    Class: History of  . VENEREAL WART 01/03/2010    Class: Question of  . RECTAL BLEEDING 01/03/2010    Class: History of    Patient's Medications  New Prescriptions   No medications on file  Previous Medications   DOLUTEGRAVIR (TIVICAY) 50 MG TABLET    Take 1 tablet (50 mg total) by mouth daily.   EMTRICITABINE-TENOFOVIR (TRUVADA) 200-300 MG PER TABLET    TAKE ONE TABLET BY MOUTH EVERY DAY   IBUPROFEN (ADVIL,MOTRIN) 100 MG TABLET    Take 100 mg by mouth every 6 (six) hours as needed.  Modified Medications   No medications on file  Discontinued Medications   No medications on file    Subjective: Francis Pacheco is in for his routine visit. He denies missing any doses of his Truvada or Tivicay Review of Systems: Pertinent items are noted in HPI.  Past Medical History  Diagnosis Date  . GERD (gastroesophageal reflux disease)   . HIV disease   . Ulcer     History  Substance Use Topics  . Smoking status: Never Smoker   . Smokeless tobacco: Never Used  . Alcohol Use: Yes     Comment: occasional    Family History  Problem Relation Age of Onset  . Heart attack Father [id number]  . Colon cancer Neg Hx     No Known Allergies  Objective: Temp: 98.3 F (36.8 C) (07/28 0851) Temp src: Oral (07/28 0851) BP: 146/81 mmHg (07/28 0851) Pulse Rate: 73 (07/28 0851)  Body mass index is 30.15 kg/(m^2).  General: He is very cheerful and talkative as usual Oral: No oropharyngeal lesions Skin: No rash Lungs: Clear Cor: Regular S1 and S2 with no murmurs   Lab Results Lab  Results  Component Value Date   WBC 3.7* 02/18/2013   HGB 14.1 02/18/2013   HCT 40.7 02/18/2013   MCV 88.7 02/18/2013   PLT 220 02/18/2013    Lab Results  Component Value Date   CREATININE 1.03 02/18/2013   BUN 15 02/18/2013   NA 139 02/18/2013   K 4.2 02/18/2013   CL 103 02/18/2013   CO2 27 02/18/2013    Lab Results  Component Value Date   ALT 16 02/18/2013   AST 18 02/18/2013   ALKPHOS 66 02/18/2013   BILITOT 0.8 02/18/2013    Lab Results  Component Value Date   CHOL 109 08/19/2013   HDL 37* 08/19/2013   LDLCALC 52 08/19/2013   TRIG 98 08/19/2013   CHOLHDL 2.9 08/19/2013    Lab Results HIV 1 RNA Quant (copies/mL)  Date Value  08/19/2013 <20   02/18/2013 <20   07/16/2012 <20      HIV-1 RNA Viral Load (no units)  Date Value  08/05/2012 <50      CD4 (no units)  Date Value  08/05/2012 237      CD4 T Cell Abs (/uL)  Date Value  08/19/2013 280*  02/18/2013 280*  07/16/2012 250*     Assessment: His HIV  infection is under very good control. He is having slow CD4 reconstitution since starting therapy 3 years ago.  Plan: 1. Continue Truvada and Tivicay 2. followup after lab work in 6 months   Cliffton Asters, MD [facility] for Infectious Disease [facility] [phone] pager   [phone] cell 09/02/2013, 9:08 AM

## 2013-12-08 ENCOUNTER — Other Ambulatory Visit: Payer: Self-pay | Admitting: *Deleted

## 2013-12-08 DIAGNOSIS — B20 Human immunodeficiency virus [HIV] disease: Secondary | ICD-10-CM

## 2013-12-08 MED ORDER — DOLUTEGRAVIR SODIUM 50 MG PO TABS: 50.0000 mg | ORAL_TABLET | Freq: Every day | ORAL | Status: DC

## 2013-12-08 MED ORDER — EMTRICITABINE-TENOFOVIR DF 200-300 MG PO TABS: ORAL_TABLET | ORAL | Status: DC

## 2013-12-08 NOTE — Progress Notes (Signed)
[facility] requesting prescriptions.  Sent, updated pharmacy preference. Andree CossHowell, Michelle M, RN

## 2014-02-19 ENCOUNTER — Other Ambulatory Visit: Payer: PRIVATE HEALTH INSURANCE

## 2014-02-19 DIAGNOSIS — B20 Human immunodeficiency virus [HIV] disease: Secondary | ICD-10-CM

## 2014-02-19 LAB — COMPREHENSIVE METABOLIC PANEL
ALT: 24 U/L (ref 0–53)
AST: 17 U/L (ref 0–37)
Albumin: 4.4 g/dL (ref 3.5–5.2)
Alkaline Phosphatase: 79 U/L (ref 39–117)
BUN: 13 mg/dL (ref 6–23)
CO2: 28 mEq/L (ref 19–32)
CREATININE: 1.13 mg/dL (ref 0.50–1.35)
Calcium: 9.5 mg/dL (ref 8.4–10.5)
Chloride: 102 mEq/L (ref 96–112)
GLUCOSE: 92 mg/dL (ref 70–99)
POTASSIUM: 4.3 meq/L (ref 3.5–5.3)
SODIUM: 138 meq/L (ref 135–145)
Total Bilirubin: 1 mg/dL (ref 0.2–1.2)
Total Protein: 7.9 g/dL (ref 6.0–8.3)

## 2014-02-19 LAB — CBC
HEMATOCRIT: 42.6 % (ref 39.0–52.0)
Hemoglobin: 15 g/dL (ref 13.0–17.0)
MCH: 31 pg (ref 26.0–34.0)
MCHC: 35.2 g/dL (ref 30.0–36.0)
MCV: 88 fL (ref 78.0–100.0)
MPV: 11 fL (ref 8.6–12.4)
Platelets: 173 10*3/uL (ref 150–400)
RBC: 4.84 MIL/uL (ref 4.22–5.81)
RDW: 13.2 % (ref 11.5–15.5)
WBC: 3.1 10*3/uL — ABNORMAL LOW (ref 4.0–10.5)

## 2014-02-19 LAB — LIPID PANEL
CHOL/HDL RATIO: 3.5 ratio
Cholesterol: 114 mg/dL (ref 0–200)
HDL: 33 mg/dL — ABNORMAL LOW (ref >39)
LDL CALC: 72 mg/dL (ref 0–99)
TRIGLYCERIDES: 46 mg/dL (ref <150)
VLDL: 9 mg/dL (ref 0–40)

## 2014-02-19 LAB — RPR

## 2014-02-20 LAB — T-HELPER CELL (CD4) - (RCID CLINIC ONLY)
CD4 % Helper T Cell: 15 % — ABNORMAL LOW (ref 33–55)
CD4 T Cell Abs: 270 /uL — ABNORMAL LOW (ref 400–2700)

## 2014-02-20 LAB — HIV-1 RNA QUANT-NO REFLEX-BLD: HIV-1 RNA Quant, Log: <1.3 {Log} (ref <1.30)

## 2014-03-05 ENCOUNTER — Ambulatory Visit (INDEPENDENT_AMBULATORY_CARE_PROVIDER_SITE_OTHER): Payer: PRIVATE HEALTH INSURANCE | Admitting: Internal Medicine

## 2014-03-05 ENCOUNTER — Encounter: Payer: Self-pay | Admitting: Internal Medicine

## 2014-03-05 DIAGNOSIS — Z21 Asymptomatic human immunodeficiency virus [HIV] infection status: Secondary | ICD-10-CM

## 2014-03-05 DIAGNOSIS — B20 Human immunodeficiency virus [HIV] disease: Secondary | ICD-10-CM

## 2014-03-05 NOTE — Progress Notes (Signed)
Patient ID: Monroe Qin, male   DOB: 1965/06/06, 49 y.o.   MRN: [id number]          Patient Active Problem List   Diagnosis Date Noted  . HIV (human immunodeficiency virus infection) 07/25/2010    Priority: High    Class: Diagnosis of  . Esophageal ulcer 11/21/2010    Class: History of  . Seborrhea 08/17/2010    Class: History of  . Periodontal disease 08/09/2010    Class: History of  . VENEREAL WART 01/03/2010    Class: Question of  . RECTAL BLEEDING 01/03/2010    Class: History of    Patient's Medications  New Prescriptions   No medications on file  Previous Medications   DOLUTEGRAVIR (TIVICAY) 50 MG TABLET    Take 1 tablet (50 mg total) by mouth daily.   EMTRICITABINE-TENOFOVIR (TRUVADA) 200-300 MG PER TABLET    TAKE ONE TABLET BY MOUTH EVERY DAY   IBUPROFEN (ADVIL,MOTRIN) 100 MG TABLET    Take 100 mg by mouth every 6 (six) hours as needed.  Modified Medications   No medications on file  Discontinued Medications   No medications on file    Subjective: Wilmore is in for his routine HIV follow-up visit. He knows the names of his medications and describes taking them correctly. He does not have any problems tolerating Truvada or Tivicay and states that he has not missed a single dose. He recently had a right maxillary molar pulled after he developed an abscessed tooth. He recently completed a course of an oral antibiotic and is feeling much better.   Review of Systems: Pertinent items are noted in HPI.  Past Medical History  Diagnosis Date  . GERD (gastroesophageal reflux disease)   . HIV disease   . Ulcer     History  Substance Use Topics  . Smoking status: Never Smoker   . Smokeless tobacco: Never Used  . Alcohol Use: Yes     Comment: occasional    Family History  Problem Relation Age of Onset  . Heart attack Father 78  . Colon cancer Neg Hx     No Known Allergies  Objective: Temp: 97.9 F (36.6 C) (01/28 0954) Temp Source: Oral (01/28  0954) BP: 148/86 mmHg (01/28 0954) Pulse Rate: 66 (01/28 0954) Body mass index is 29.69 kg/(m^2).  General: He is in very good spirits as usual Oral: No oropharyngeal lesions. Some missing teeth.  Skin: No rash Lungs: Clear Cor: Regular S1 and S2 with no murmurs   Lab Results Lab Results  Component Value Date   WBC 3.1* 02/19/2014   HGB 15.0 02/19/2014   HCT 42.6 02/19/2014   MCV 88.0 02/19/2014   PLT 173 02/19/2014    Lab Results  Component Value Date   CREATININE 1.13 02/19/2014   BUN 13 02/19/2014   NA 138 02/19/2014   K 4.3 02/19/2014   CL 102 02/19/2014   CO2 28 02/19/2014    Lab Results  Component Value Date   ALT 24 02/19/2014   AST 17 02/19/2014   ALKPHOS 79 02/19/2014   BILITOT 1.0 02/19/2014    Lab Results  Component Value Date   CHOL 114 02/19/2014   HDL 33* 02/19/2014   LDLCALC 72 02/19/2014   TRIG 46 02/19/2014   CHOLHDL 3.5 02/19/2014    Lab Results HIV 1 RNA QUANT (copies/mL)  Date Value  02/19/2014 <20  08/19/2013 <20  02/18/2013 <20   HIV-1 RNA VIRAL LOAD (no units)  Date Value  08/05/2012 <50   CD4 (no units)  Date Value  08/05/2012 237   CD4 T CELL ABS (/uL)  Date Value  02/19/2014 270*  08/19/2013 280*  02/18/2013 280*     Assessment: He is doing very well with his current HIV regimen. His viral load has been consistently undetectable and he's had good CD4 reconstitution since his diagnosis 4 years ago.  Plan: 1. Continue current antiretroviral regimen 2. Follow-up after lab work in 6 months   Cliffton AstersJohn Yulitza Shorts, MD [facility] [phone] pager   [phone] cell 03/05/2014, 10:17 AM

## 2014-07-14 ENCOUNTER — Other Ambulatory Visit: Payer: Self-pay | Admitting: Internal Medicine

## 2014-08-03 ENCOUNTER — Other Ambulatory Visit: Payer: Self-pay | Admitting: Internal Medicine

## 2014-08-03 DIAGNOSIS — B20 Human immunodeficiency virus [HIV] disease: Secondary | ICD-10-CM

## 2014-08-18 ENCOUNTER — Other Ambulatory Visit: Payer: PRIVATE HEALTH INSURANCE

## 2014-08-18 DIAGNOSIS — B20 Human immunodeficiency virus [HIV] disease: Secondary | ICD-10-CM

## 2014-08-18 LAB — LIPID PANEL
Cholesterol: 124 mg/dL (ref 0–200)
HDL: 44 mg/dL (ref >=40)
LDL Cholesterol: 67 mg/dL (ref 0–99)
TRIGLYCERIDES: 65 mg/dL (ref <150)
Total CHOL/HDL Ratio: 2.8 Ratio
VLDL: 13 mg/dL (ref 0–40)

## 2014-08-18 LAB — CBC
HCT: 43 % (ref 39.0–52.0)
HEMOGLOBIN: 14.8 g/dL (ref 13.0–17.0)
MCH: 31.2 pg (ref 26.0–34.0)
MCHC: 34.4 g/dL (ref 30.0–36.0)
MCV: 90.7 fL (ref 78.0–100.0)
MPV: 10 fL (ref 8.6–12.4)
PLATELETS: 177 10*3/uL (ref 150–400)
RBC: 4.74 MIL/uL (ref 4.22–5.81)
RDW: 13 % (ref 11.5–15.5)
WBC: 4.2 10*3/uL (ref 4.0–10.5)

## 2014-08-18 LAB — COMPREHENSIVE METABOLIC PANEL
ALT: 19 U/L (ref 0–53)
AST: 15 U/L (ref 0–37)
Albumin: 4.5 g/dL (ref 3.5–5.2)
Alkaline Phosphatase: 66 U/L (ref 39–117)
BILIRUBIN TOTAL: 1 mg/dL (ref 0.2–1.2)
BUN: 15 mg/dL (ref 6–23)
CALCIUM: 9.3 mg/dL (ref 8.4–10.5)
CHLORIDE: 103 meq/L (ref 96–112)
CO2: 24 mEq/L (ref 19–32)
Creat: 1.12 mg/dL (ref 0.50–1.35)
Glucose, Bld: 84 mg/dL (ref 70–99)
POTASSIUM: 4.5 meq/L (ref 3.5–5.3)
SODIUM: 138 meq/L (ref 135–145)
TOTAL PROTEIN: 7.3 g/dL (ref 6.0–8.3)

## 2014-08-19 LAB — T-HELPER CELL (CD4) - (RCID CLINIC ONLY)
CD4 % Helper T Cell: 16 % — ABNORMAL LOW (ref 33–55)
CD4 T Cell Abs: 340 /uL — ABNORMAL LOW (ref 400–2700)

## 2014-08-19 LAB — HIV-1 RNA QUANT-NO REFLEX-BLD: HIV-1 RNA Quant, Log: <1.3 {Log} (ref <1.30)

## 2014-08-19 LAB — HEPATITIS B SURFACE ANTIBODY,QUALITATIVE: Hep B S Ab: POSITIVE — AB

## 2014-08-19 LAB — RPR

## 2014-09-01 ENCOUNTER — Encounter: Payer: Self-pay | Admitting: Internal Medicine

## 2014-09-01 ENCOUNTER — Ambulatory Visit (INDEPENDENT_AMBULATORY_CARE_PROVIDER_SITE_OTHER): Payer: PRIVATE HEALTH INSURANCE | Admitting: Internal Medicine

## 2014-09-01 DIAGNOSIS — Z21 Asymptomatic human immunodeficiency virus [HIV] infection status: Secondary | ICD-10-CM

## 2014-09-01 DIAGNOSIS — B20 Human immunodeficiency virus [HIV] disease: Secondary | ICD-10-CM

## 2014-09-01 NOTE — Progress Notes (Signed)
Patient ID: Francis Pacheco, male   DOB: 17-May-1965, 49 y.o.   MRN: [id number]          Patient Active Problem List   Diagnosis Date Noted  . HIV (human immunodeficiency virus infection) 07/25/2010    Priority: High    Class: Diagnosis of  . Esophageal ulcer 11/21/2010    Class: History of  . Seborrhea 08/17/2010    Class: History of  . Periodontal disease 08/09/2010    Class: History of  . VENEREAL WART 01/03/2010    Class: Question of  . RECTAL BLEEDING 01/03/2010    Class: History of    Patient's Medications  New Prescriptions   No medications on file  Previous Medications   IBUPROFEN (ADVIL,MOTRIN) 100 MG TABLET    Take 100 mg by mouth every 6 (six) hours as needed.   TIVICAY 50 MG TABLET    TAKE 1 TABLET BY MOUTH DAILY.   TRUVADA 200-300 MG PER TABLET    TAKE 1 TABLET BY MOUTH EVERY DAY  Modified Medications   No medications on file  Discontinued Medications   No medications on file    Subjective: Francis Pacheco is in for his routine HIV follow-up visit. He denies missing any doses of his Truvada or Tivicay. He takes them first thing each morning. He is not on any other medications. 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  Review of Systems: Constitutional: negative Eyes: negative Ears, nose, mouth, throat, and face: negative Respiratory: negative Cardiovascular: negative Gastrointestinal: negative Genitourinary:negative  Past Medical History  Diagnosis Date  . GERD (gastroesophageal reflux disease)   . HIV disease   . Ulcer     History  Substance Use Topics  . Smoking status: Never Smoker   . Smokeless tobacco: Never Used  . Alcohol Use: 0.0 oz/week    0 Standard drinks or equivalent per week     Comment: occasional    Family History  Problem Relation Age of Onset  . Heart attack Father 39  . Colon cancer Neg Hx     No Known Allergies  Objective: Temp: 98.4 F (36.9 C)  (07/26 0917) Temp Source: Oral (07/26 0917) BP: 144/94 mmHg (07/26 0921) Pulse Rate: 91 (07/26 0921) Body mass index is 30.05 kg/(m^2).  General: His weight is stable at 227 pounds. Oral: No oropharyngeal lesions Skin: No rash Lungs: Clear Cor: Regular S1 and S2 with no murmurs Abdomen: Soft and nontender Joints and extremities: Normal Neuro: Alert with normal speech and conversation Mood: Bright and smiling as usual  Lab Results Lab Results  Component Value Date   WBC 4.2 08/18/2014   HGB 14.8 08/18/2014   HCT 43.0 08/18/2014   MCV 90.7 08/18/2014   PLT 177 08/18/2014    Lab Results  Component Value Date   CREATININE 1.12 08/18/2014   BUN 15 08/18/2014   NA 138 08/18/2014   K 4.5 08/18/2014   CL 103 08/18/2014   CO2 24 08/18/2014    Lab Results  Component Value Date   ALT 19 08/18/2014   AST 15 08/18/2014   ALKPHOS 66 08/18/2014   BILITOT 1.0 08/18/2014    Lab Results  Component Value Date   CHOL 124 08/18/2014   HDL 44 08/18/2014   LDLCALC 67 08/18/2014   TRIG 65 08/18/2014   CHOLHDL 2.8 08/18/2014    Lab Results HIV 1 RNA QUANT (copies/mL)  Date Value  08/18/2014 <20  02/19/2014 <  20  08/19/2013 <20   HIV-1 RNA VIRAL LOAD (no units)  Date Value  08/05/2012 <50   CD4 (no units)  Date Value  08/05/2012 237   CD4 T CELL ABS (/uL)  Date Value  08/18/2014 340*  02/19/2014 270*  08/19/2013 280*     Assessment: His HIV infection remains under very good control and he's had steady CD4 reconstitution over the past 4 years. I will continue Truvada and Tivicay.  His blood pressure is slightly elevated today but he has not had a consistent pattern of elevation recently so I will simply continue to monitor his blood pressure each visit.  His body mass index is just over 30. I did talk to him about the potential benefits of losing 20-25 pounds over the next few years.  Plan: 1. Continue Truvada and Tivicay 2. Monitor blood pressure and  weight 3. Follow-up after lab work in 6 months   Cliffton Asters, MD [facility] for Infectious Disease [facility] [phone] pager   [phone] cell 09/01/2014, 9:30 AM

## 2014-11-09 ENCOUNTER — Other Ambulatory Visit: Payer: Self-pay | Admitting: Internal Medicine

## 2014-11-10 ENCOUNTER — Other Ambulatory Visit: Payer: Self-pay | Admitting: *Deleted

## 2014-11-10 MED ORDER — EMTRICITABINE-TENOFOVIR DF 200-300 MG PO TABS: 1.0000 | ORAL_TABLET | Freq: Every day | ORAL | Status: DC

## 2014-11-10 MED ORDER — DOLUTEGRAVIR SODIUM 50 MG PO TABS: 50.0000 mg | ORAL_TABLET | Freq: Every day | ORAL | Status: DC

## 2015-01-11 ENCOUNTER — Telehealth: Payer: Self-pay | Admitting: *Deleted

## 2015-01-11 NOTE — Telephone Encounter (Addendum)
PA for Truvada completed at [employer].  Spoke with pharmacy, pt has received the medication.

## 2015-08-26 ENCOUNTER — Other Ambulatory Visit: Payer: Self-pay | Admitting: Internal Medicine

## 2015-08-26 DIAGNOSIS — B20 Human immunodeficiency virus [HIV] disease: Secondary | ICD-10-CM

## 2015-09-27 ENCOUNTER — Other Ambulatory Visit: Payer: Self-pay | Admitting: Internal Medicine

## 2015-09-27 DIAGNOSIS — B20 Human immunodeficiency virus [HIV] disease: Secondary | ICD-10-CM

## 2015-09-27 MED ORDER — EMTRICITABINE-TENOFOVIR DF 200-300 MG PO TABS: 1.0000 | ORAL_TABLET | Freq: Every day | ORAL | 2 refills | Status: DC

## 2015-09-27 NOTE — Telephone Encounter (Signed)
Last MD appt 08/2014, appt made for 11/22/15.

## 2015-10-12 ENCOUNTER — Other Ambulatory Visit: Payer: PRIVATE HEALTH INSURANCE

## 2015-10-12 DIAGNOSIS — B20 Human immunodeficiency virus [HIV] disease: Secondary | ICD-10-CM

## 2015-10-12 DIAGNOSIS — Z113 Encounter for screening for infections with a predominantly sexual mode of transmission: Secondary | ICD-10-CM

## 2015-10-12 DIAGNOSIS — Z79899 Other long term (current) drug therapy: Secondary | ICD-10-CM

## 2015-10-12 LAB — COMPREHENSIVE METABOLIC PANEL
ALBUMIN: 4.5 g/dL (ref 3.6–5.1)
ALK PHOS: 70 U/L (ref 40–115)
ALT: 24 U/L (ref 9–46)
AST: 16 U/L (ref 10–35)
BILIRUBIN TOTAL: 1.1 mg/dL (ref 0.2–1.2)
BUN: 14 mg/dL (ref 7–25)
CO2: 27 mmol/L (ref 20–31)
CREATININE: 1.18 mg/dL (ref 0.70–1.33)
Calcium: 9.2 mg/dL (ref 8.6–10.3)
Chloride: 103 mmol/L (ref 98–110)
Glucose, Bld: 105 mg/dL — ABNORMAL HIGH (ref 65–99)
Potassium: 4.3 mmol/L (ref 3.5–5.3)
SODIUM: 137 mmol/L (ref 135–146)
TOTAL PROTEIN: 7.4 g/dL (ref 6.1–8.1)

## 2015-10-12 LAB — LIPID PANEL
CHOL/HDL RATIO: 2.6 ratio (ref <=5.0)
Cholesterol: 128 mg/dL (ref 125–200)
HDL: 50 mg/dL (ref >=40)
LDL CALC: 67 mg/dL (ref <130)
TRIGLYCERIDES: 56 mg/dL (ref <150)
VLDL: 11 mg/dL (ref <30)

## 2015-10-12 LAB — CBC WITH DIFFERENTIAL/PLATELET
BASOS ABS: 0 {cells}/uL (ref 0–200)
Basophils Relative: 0 %
EOS ABS: 66 {cells}/uL (ref 15–500)
Eosinophils Relative: 2 %
HEMATOCRIT: 41.4 % (ref 38.5–50.0)
HEMOGLOBIN: 14.6 g/dL (ref 13.2–17.1)
LYMPHS ABS: 1716 {cells}/uL (ref 850–3900)
Lymphocytes Relative: 52 %
MCH: 31.1 pg (ref 27.0–33.0)
MCHC: 35.3 g/dL (ref 32.0–36.0)
MCV: 88.3 fL (ref 80.0–100.0)
MONO ABS: 231 {cells}/uL (ref 200–950)
MPV: 10.4 fL (ref 7.5–12.5)
Monocytes Relative: 7 %
NEUTROS ABS: 1287 {cells}/uL — AB (ref 1500–7800)
NEUTROS PCT: 39 %
Platelets: 200 10*3/uL (ref 140–400)
RBC: 4.69 MIL/uL (ref 4.20–5.80)
RDW: 12.7 % (ref 11.0–15.0)
WBC: 3.3 10*3/uL — ABNORMAL LOW (ref 3.8–10.8)

## 2015-10-13 LAB — T-HELPER CELL (CD4) - (RCID CLINIC ONLY)
CD4 % Helper T Cell: 16 % — ABNORMAL LOW (ref 33–55)
CD4 T Cell Abs: 300 /uL — ABNORMAL LOW (ref 400–2700)

## 2015-10-13 LAB — RPR

## 2015-10-14 LAB — HIV-1 RNA QUANT-NO REFLEX-BLD: HIV-1 RNA Quant, Log: <1.3 Log copies/mL (ref <1.30)

## 2015-11-22 ENCOUNTER — Ambulatory Visit: Payer: PRIVATE HEALTH INSURANCE | Admitting: Internal Medicine

## 2015-12-06 ENCOUNTER — Encounter: Payer: Self-pay | Admitting: Internal Medicine

## 2015-12-06 ENCOUNTER — Ambulatory Visit (INDEPENDENT_AMBULATORY_CARE_PROVIDER_SITE_OTHER): Payer: PRIVATE HEALTH INSURANCE | Admitting: Internal Medicine

## 2015-12-06 DIAGNOSIS — Z23 Encounter for immunization: Secondary | ICD-10-CM

## 2015-12-06 DIAGNOSIS — E669 Obesity, unspecified: Secondary | ICD-10-CM | POA: Insufficient documentation

## 2015-12-06 DIAGNOSIS — B20 Human immunodeficiency virus [HIV] disease: Secondary | ICD-10-CM

## 2015-12-06 DIAGNOSIS — R739 Hyperglycemia, unspecified: Secondary | ICD-10-CM

## 2015-12-06 DIAGNOSIS — R030 Elevated blood-pressure reading, without diagnosis of hypertension: Secondary | ICD-10-CM

## 2015-12-06 MED ORDER — EMTRICITABINE-TENOFOVIR AF 200-25 MG PO TABS: 1.0000 | ORAL_TABLET | Freq: Every day | ORAL | 11 refills | Status: DC

## 2015-12-06 MED ORDER — DOLUTEGRAVIR SODIUM 50 MG PO TABS: 50.0000 mg | ORAL_TABLET | Freq: Every day | ORAL | 11 refills | Status: DC

## 2015-12-06 NOTE — Progress Notes (Signed)
Patient Active Problem List   Diagnosis Date Noted  . HIV (human immunodeficiency virus infection) (HCC) 07/25/2010    Priority: High    Class: Diagnosis of  . Obesity 12/06/2015  . Elevated blood-pressure reading without diagnosis of hypertension 12/06/2015  . Hyperglycemia 12/06/2015  . Esophageal ulcer 11/21/2010    Class: History of  . Seborrhea 08/17/2010    Class: History of  . Periodontal disease 08/09/2010    Class: History of  . VENEREAL WART 01/03/2010    Class: Question of  . RECTAL BLEEDING 01/03/2010    Class: History of    Patient's Medications  New Prescriptions   EMTRICITABINE-TENOFOVIR AF (DESCOVY) 200-25 MG TABLET    Take 1 tablet by mouth daily.  Previous Medications   IBUPROFEN (ADVIL,MOTRIN) 100 MG TABLET    Take 100 mg by mouth every 6 (six) hours as needed.  Modified Medications   Modified Medication Previous Medication   DOLUTEGRAVIR (TIVICAY) 50 MG TABLET TIVICAY 50 MG tablet      Take 1 tablet (50 mg total) by mouth daily.    TAKE 1 TABLET BY MOUTH DAILY.  Discontinued Medications   EMTRICITABINE-TENOFOVIR (TRUVADA) 200-300 MG TABLET    Take 1 tablet by mouth daily.    Subjective: Francis Pacheco is in for his routine HIV follow-up visit. He continues to take Truvada and Tivicay without difficulty. He states that his pharmacy was late filling his prescription one time but it did not cause him to miss. He is feeling much better since starting therapy several years ago. He has no current concerns or complaints.   Review of Systems: Review of Systems  Constitutional: Negative for chills, diaphoresis, fever, malaise/fatigue and weight loss.  HENT: Negative for sore throat.   Respiratory: Negative for cough, sputum production and shortness of breath.   Cardiovascular: Negative for chest pain.  Gastrointestinal: Negative for abdominal pain, diarrhea, nausea and vomiting.  Genitourinary: Negative for dysuria.  Musculoskeletal: Negative for joint  pain and myalgias.  Skin: Negative for rash.  Neurological: Negative for dizziness and headaches.  Psychiatric/Behavioral: Negative for depression and substance abuse. The patient is not nervous/anxious.     Past Medical History:  Diagnosis Date  . GERD (gastroesophageal reflux disease)   . HIV disease (HCC)   . Ulcer [facility](HCC)     Social History  Substance Use Topics  . Smoking status: Never Smoker  . Smokeless tobacco: Never Used  . Alcohol use 0.0 oz/week     Comment: occasional    Family History  Problem Relation Age of Onset  . Heart attack Father 9668  . Colon cancer Neg Hx     No Known Allergies  Objective:  Vitals:   12/06/15 1539  BP: (!) 148/91  Pulse: 90  Temp: 98.1 F (36.7 C)  TempSrc: Oral  Weight: 230 lb 8 oz (104.6 kg)  Height: 6\' 1"  (1.854 m)   Body mass index is 30.41 kg/m.  Physical Exam  Constitutional: He is oriented to person, place, and time.  He has had weight gain over the past 5 years such that his BMI is now over 30.  HENT:  Mouth/Throat: No oropharyngeal exudate.  Eyes: Conjunctivae are normal.  Cardiovascular: Normal rate and regular rhythm.   No murmur heard. Pulmonary/Chest: Effort normal and breath sounds normal. He has no wheezes. He has no rales.  Abdominal: Soft. He exhibits no mass. There is no tenderness.  Musculoskeletal: Normal range of motion. He exhibits  no edema or tenderness.  Neurological: He is alert and oriented to person, place, and time.  Skin: No rash noted.  Psychiatric: Mood and affect normal.    Lab Results Lab Results  Component Value Date   WBC 3.3 (L) 10/12/2015   HGB 14.6 10/12/2015   HCT 41.4 10/12/2015   MCV 88.3 10/12/2015   PLT 200 10/12/2015    Lab Results  Component Value Date   CREATININE 1.18 10/12/2015   BUN 14 10/12/2015   NA 137 10/12/2015   K 4.3 10/12/2015   CL 103 10/12/2015   CO2 27 10/12/2015    Lab Results  Component Value Date   ALT 24 10/12/2015   AST 16 10/12/2015    ALKPHOS 70 10/12/2015   BILITOT 1.1 10/12/2015    Lab Results  Component Value Date   CHOL 128 10/12/2015   HDL 50 10/12/2015   LDLCALC 67 10/12/2015   TRIG 56 10/12/2015   CHOLHDL 2.6 10/12/2015   HIV 1 RNA Quant (copies/mL)  Date Value  10/12/2015 <20  08/18/2014 <20  02/19/2014 <20   HIV-1 RNA Viral Load (no units)  Date Value  08/05/2012 <50   CD4 (no units)  Date Value  08/05/2012 237   CD4 T Cell Abs (/uL)  Date Value  10/12/2015 300 (L)  08/18/2014 340 (L)  02/19/2014 270 (L)     Problem List Items Addressed This Visit      High   HIV (human immunodeficiency virus infection) (HCC)    His infection has come under excellent control and he is in a lasting remission. He is having steady CD4 reconstitution. I will update his regimen to Descovy plus Tivicay. He will follow-up after lab work in one year.      Relevant Medications   emtricitabine-tenofovir AF (DESCOVY) 200-25 MG tablet   dolutegravir (TIVICAY) 50 MG tablet   Other Relevant Orders   T-helper cell (CD4)- ([facility] clinic only)   HIV 1 RNA quant-no reflex-bld   CBC   Comprehensive metabolic panel   Lipid panel   RPR   Pneumococcal polysaccharide vaccine 23-valent greater than or equal to 2yo subcutaneous/IM     Unprioritized   Elevated blood-pressure reading without diagnosis of hypertension    He probably has borderline hypertension.      Hyperglycemia    His blood sugar was just slightly elevated at 105. He has no family history of diabetes but his father did die of a heart attack and he is at risk for metabolic syndrome. He tells me that he did discuss some of this with his primary care provider, Dr. Desmond LopeQuarterman at his visit about 3 months ago.       Other Visit Diagnoses    Human immunodeficiency virus (HIV) disease (HCC)       Relevant Medications   emtricitabine-tenofovir AF (DESCOVY) 200-25 MG tablet   dolutegravir (TIVICAY) 50 MG tablet        Cliffton AstersJohn Makya Yurko, MD [facility] [phone] pager   [phone] cell 12/06/2015, 4:16 PM

## 2015-12-06 NOTE — Assessment & Plan Note (Signed)
I talked him again about the importance of not gaining more weight.

## 2015-12-06 NOTE — Assessment & Plan Note (Signed)
His infection has come under excellent control and he is in a lasting remission. He is having steady CD4 reconstitution. I will update his regimen to Descovy plus Tivicay. He will follow-up after lab work in one year.

## 2015-12-06 NOTE — Assessment & Plan Note (Signed)
His blood sugar was just slightly elevated at 105. He has no family history of diabetes but his father did die of a heart attack and he is at risk for metabolic syndrome. He tells me that he did discuss some of this with his primary care provider, Dr. Desmond LopeQuarterman at his visit about 3 months ago.

## 2015-12-06 NOTE — Assessment & Plan Note (Signed)
He probably has borderline hypertension.

## 2016-03-27 ENCOUNTER — Other Ambulatory Visit: Payer: Self-pay | Admitting: Internal Medicine

## 2016-03-27 ENCOUNTER — Telehealth: Payer: Self-pay

## 2016-03-27 DIAGNOSIS — B20 Human immunodeficiency virus [HIV] disease: Secondary | ICD-10-CM

## 2016-03-27 NOTE — Telephone Encounter (Signed)
Patient came by for co-pay cards.      He was given a co-pay card for both drugs.   Laurell Josephsammy K Brindley Madarang, RN

## 2016-03-28 ENCOUNTER — Telehealth: Payer: Self-pay

## 2016-03-28 ENCOUNTER — Other Ambulatory Visit: Payer: Self-pay

## 2016-03-28 ENCOUNTER — Other Ambulatory Visit: Payer: Self-pay | Admitting: *Deleted

## 2016-03-28 DIAGNOSIS — B20 Human immunodeficiency virus [HIV] disease: Secondary | ICD-10-CM

## 2016-03-28 MED ORDER — DOLUTEGRAVIR SODIUM 50 MG PO TABS: 50.0000 mg | ORAL_TABLET | Freq: Every day | ORAL | 11 refills | Status: DC

## 2016-03-28 MED ORDER — DOLUTEGRAVIR SODIUM 50 MG PO TABS: 50.0000 mg | ORAL_TABLET | Freq: Every day | ORAL | 5 refills | Status: DC

## 2016-03-28 MED ORDER — EMTRICITABINE-TENOFOVIR AF 200-25 MG PO TABS: 1.0000 | ORAL_TABLET | Freq: Every day | ORAL | 11 refills | Status: DC

## 2016-03-28 MED ORDER — EMTRICITABINE-TENOFOVIR AF 200-25 MG PO TABS: 1.0000 | ORAL_TABLET | Freq: Every day | ORAL | 5 refills | Status: DC

## 2016-03-28 NOTE — Telephone Encounter (Signed)
Patient is calling to say medications were sent to wrong  Pharmacy.  [facility] should [facility] and the pharmacy needs the lot number.   I spoke with pharmacy and the scripts are ready without problem.  The patient has co-pay cards he should have activated.     Laurell Josephsammy K King, RN

## 2016-07-04 ENCOUNTER — Other Ambulatory Visit: Payer: PRIVATE HEALTH INSURANCE

## 2017-02-15 ENCOUNTER — Ambulatory Visit: Payer: Self-pay

## 2017-02-15 ENCOUNTER — Other Ambulatory Visit: Payer: PRIVATE HEALTH INSURANCE

## 2017-02-15 ENCOUNTER — Other Ambulatory Visit: Payer: Self-pay | Admitting: *Deleted

## 2017-02-15 DIAGNOSIS — B20 Human immunodeficiency virus [HIV] disease: Secondary | ICD-10-CM

## 2017-02-15 MED ORDER — EMTRICITABINE-TENOFOVIR AF 200-25 MG PO TABS: 1.0000 | ORAL_TABLET | Freq: Every day | ORAL | 5 refills | Status: DC

## 2017-02-15 MED ORDER — DOLUTEGRAVIR SODIUM 50 MG PO TABS: 50.0000 mg | ORAL_TABLET | Freq: Every day | ORAL | 5 refills | Status: DC

## 2017-02-16 LAB — T-HELPER CELL (CD4) - (RCID CLINIC ONLY)
CD4 % Helper T Cell: 17 % — ABNORMAL LOW (ref 33–55)
CD4 T Cell Abs: 340 /uL — ABNORMAL LOW (ref 400–2700)

## 2017-02-16 LAB — COMPLETE METABOLIC PANEL WITH GFR
AG RATIO: 1.6 (calc) (ref 1.0–2.5)
ALBUMIN MSPROF: 4.5 g/dL (ref 3.6–5.1)
ALT: 17 U/L (ref 9–46)
AST: 13 U/L (ref 10–35)
Alkaline phosphatase (APISO): 57 U/L (ref 40–115)
BILIRUBIN TOTAL: 1 mg/dL (ref 0.2–1.2)
BUN: 13 mg/dL (ref 7–25)
CO2: 27 mmol/L (ref 20–32)
Calcium: 9.5 mg/dL (ref 8.6–10.3)
Chloride: 103 mmol/L (ref 98–110)
Creat: 1.2 mg/dL (ref 0.70–1.33)
GFR, EST AFRICAN AMERICAN: 80 mL/min/{1.73_m2} (ref >=60)
GFR, Est Non African American: 69 mL/min/{1.73_m2} (ref >=60)
GLOBULIN: 2.8 g/dL (ref 1.9–3.7)
Glucose, Bld: 82 mg/dL (ref 65–99)
Potassium: 4.8 mmol/L (ref 3.5–5.3)
SODIUM: 139 mmol/L (ref 135–146)
TOTAL PROTEIN: 7.3 g/dL (ref 6.1–8.1)

## 2017-02-16 LAB — URINE CYTOLOGY ANCILLARY ONLY
CHLAMYDIA, DNA PROBE: POSITIVE — AB
Neisseria Gonorrhea: NEGATIVE

## 2017-02-16 LAB — RPR: RPR: REACTIVE — AB

## 2017-02-16 LAB — CBC WITH DIFFERENTIAL/PLATELET
BASOS ABS: 22 {cells}/uL (ref 0–200)
Basophils Relative: 0.6 %
EOS PCT: 2.2 %
Eosinophils Absolute: 79 cells/uL (ref 15–500)
HEMATOCRIT: 42.2 % (ref 38.5–50.0)
Hemoglobin: 14.4 g/dL (ref 13.2–17.1)
Lymphs Abs: 1937 cells/uL (ref 850–3900)
MCH: 30.7 pg (ref 27.0–33.0)
MCHC: 34.1 g/dL (ref 32.0–36.0)
MCV: 90 fL (ref 80.0–100.0)
MPV: 10.7 fL (ref 7.5–12.5)
Monocytes Relative: 11.5 %
NEUTROS PCT: 31.9 %
Neutro Abs: 1148 cells/uL — ABNORMAL LOW (ref 1500–7800)
PLATELETS: 191 10*3/uL (ref 140–400)
RBC: 4.69 10*6/uL (ref 4.20–5.80)
RDW: 12.3 % (ref 11.0–15.0)
TOTAL LYMPHOCYTE: 53.8 %
WBC mixed population: 414 cells/uL (ref 200–950)
WBC: 3.6 10*3/uL — AB (ref 3.8–10.8)

## 2017-02-16 LAB — FLUORESCENT TREPONEMAL AB(FTA)-IGG-BLD: FLUORESCENT TREPONEMAL ABS: REACTIVE — AB

## 2017-02-16 LAB — RPR TITER

## 2017-02-17 LAB — HIV-1 RNA ULTRAQUANT REFLEX TO GENTYP+: HIV 1 RNA Quant: <20 Copies/mL

## 2017-02-21 ENCOUNTER — Telehealth: Payer: Self-pay | Admitting: *Deleted

## 2017-02-21 ENCOUNTER — Other Ambulatory Visit: Payer: Self-pay | Admitting: Internal Medicine

## 2017-02-21 DIAGNOSIS — A749 Chlamydial infection, unspecified: Secondary | ICD-10-CM

## 2017-02-21 MED ORDER — AZITHROMYCIN 500 MG PO TABS
1000.0000 mg | ORAL_TABLET | Freq: Every day | ORAL | 0 refills | Status: DC
Start: 2017-02-21 — End: 2020-06-09

## 2017-02-21 MED ORDER — AZITHROMYCIN 1 G PO PACK: 1.0000 g | PACK | Freq: Once | ORAL | 0 refills | Status: DC

## 2017-02-21 NOTE — Telephone Encounter (Signed)
-----   Message from Cliffton AstersJohn Campbell, MD sent at 02/21/2017  1:15 PM EST ----- Order placed for azithromycin.

## 2017-02-21 NOTE — Telephone Encounter (Signed)
Left message for patient that lab results + for chlamydia requiring treatment per Dr. Orvan Falconerampbell.  [facility]

## 2017-02-22 ENCOUNTER — Encounter: Payer: Self-pay | Admitting: Internal Medicine

## 2017-02-22 ENCOUNTER — Telehealth: Payer: Self-pay | Admitting: *Deleted

## 2017-02-22 ENCOUNTER — Ambulatory Visit (INDEPENDENT_AMBULATORY_CARE_PROVIDER_SITE_OTHER): Payer: Self-pay | Admitting: Licensed Clinical Social Worker

## 2017-02-22 ENCOUNTER — Telehealth: Payer: Self-pay | Admitting: Licensed Clinical Social Worker

## 2017-02-22 DIAGNOSIS — Z630 Problems in relationship with spouse or partner: Secondary | ICD-10-CM

## 2017-02-22 NOTE — Telephone Encounter (Signed)
Walk-in to clinic to discuss recent abnormal lab requiring treatment.  Patient and wife arrived to discuss positive chlamydia urine test.  RN explained that the patient needs treatment and that his partner(s) need evaluation and treatment.  RN advised that condom use with every sexual encounter is recommended to avoid sexually transmitted disease.  The patient's wife became tearful.  RN offered the [facility] counselor services to the patient and wife.  [facility] counselor not available at this time.  Patient and wife given [facility] counselor's business card and [facility].  Patient declined to make an appointment at this time.

## 2017-02-22 NOTE — Telephone Encounter (Signed)
[facility] and scheduled first appointment for today at 1:30 pm.  Francis AlbertsSherry Kelvin Pacheco, [facility]

## 2017-02-26 ENCOUNTER — Telehealth: Payer: Self-pay | Admitting: Licensed Clinical Social Worker

## 2017-02-26 NOTE — Telephone Encounter (Signed)
[facility] to ask about his new insurance provider, to receive in-network referrals for counseling services.  Patient stated that he is not at home and will call and leave a message tonight when he gets home, with the information.  Vergia AlbertsSherry Hollyanne Schloesser, [facility]

## 2017-02-26 NOTE — BH Specialist Note (Signed)
Integrated Behavioral Health Initial Visit  MRN: [id number] Name: Francis Pacheco  Number of Integrated Behavioral Health Clinician visits:: 1/6 Session Start time: 1:35 pm  Session End time: 2:45 pm Total time: 1 hour 10 mins  Type of Service: Integrated Behavioral Health- Individual/Family Interpretor:No. Interpretor Name and Language: N/A  SUBJECTIVE: Francis CarinaMichael Mccutchan is a 52 y.o. male accompanied by Spouse Patient was referred by Triage nurse for martial/relational issues and is a patient of Dr. Orvan Falconerampbell.  Patient reports the following symptoms/concerns: Patient reported that he recently tested positive for chlamydia and has not engaged in recent extra martial affairs.  The patient has been married for the past 3 years, after knowing each other for two years.  The husband stated that he wants to save his marriage and wants to allow his wife to express her emotions.  Patient's wife expressed that she is hurt and angry about the diagnosis and questions his fidelity, as she just had a hysterectomy 6 weeks ago and has not engaged in sexual intercourse with her husband.  She feels that if she would have been positive with Chlamydia before surgery, her pre-operation testing would have been positive.  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  Additionally she shared that the patient has a history of looking at online porn ([email or web address]) that stopped maybe a year ago.  The wife stated she felt the patient was being dishonest and offered the opportunity to "come clean".  The patient denied having additional affairs and reported being faithful.  The patient denied having an infinity for certain body types and reported that he is satisfied with his current sex life.  The patient was quiet during the majority of the session and allowed his wife to express her concerns.  Patient was tearful at  times, but mainly sat quietly looking ahead.  Patient's wife reported history of couples counseling in the past, which was unsuccessful.  [facility] suggested patient engage in individual counseling with male therapist to address relationship distress and sex counseling.  Patient was receptive and stated that he just received new insurance cards in the mail.  So that [facility] can provide in-network referrals, patient is going to call back with the name of the insurance agency.  Duration of problem: since Jan 16th; Severity of problem: moderate  OBJECTIVE: Mood: sad and Affect: Constricted and Tearful Risk of harm to self or others: No plan to harm self or others  ASSESSMENT: Patient is currently experiencing relationship distress from receiving a positive STI result and may benefit from individual/couples counseling and STI prevention education.  GOALS ADDRESSED: Patient will: 1. Reduce symptoms of: relationship distress 2. Increase knowledge and/or ability of: coping skills and healthy habits  3. Demonstrate ability to: Increase healthy adjustment to current life circumstances and Increase adequate support systems for patient/family  INTERVENTIONS: Interventions utilized: Supportive Counseling   PLAN: 1. Patient is going to call back and inform [facility] of the insurance so three referrals can be provided for in-network behavioral health providers.  Vergia AlbertsSherry Reginae Wolfrey, [facility]

## 2017-03-07 ENCOUNTER — Ambulatory Visit (INDEPENDENT_AMBULATORY_CARE_PROVIDER_SITE_OTHER): Payer: BLUE CROSS/BLUE SHIELD | Admitting: Internal Medicine

## 2017-03-07 ENCOUNTER — Other Ambulatory Visit: Payer: Self-pay | Admitting: Behavioral Health

## 2017-03-07 DIAGNOSIS — B20 Human immunodeficiency virus [HIV] disease: Secondary | ICD-10-CM

## 2017-03-07 DIAGNOSIS — A749 Chlamydial infection, unspecified: Secondary | ICD-10-CM

## 2017-03-07 DIAGNOSIS — Z21 Asymptomatic human immunodeficiency virus [HIV] infection status: Secondary | ICD-10-CM

## 2017-03-07 DIAGNOSIS — A528 Late syphilis, latent: Secondary | ICD-10-CM | POA: Diagnosis not present

## 2017-03-07 MED ORDER — PENICILLIN G BENZATHINE 1200000 UNIT/2ML IM SUSP
1.2000 10*6.[IU] | INTRAMUSCULAR | Status: AC
  Administered 2017-03-07 – 2017-03-21 (×3): 1.2 10*6.[IU] via INTRAMUSCULAR

## 2017-03-07 NOTE — Assessment & Plan Note (Signed)
He has been treated 

## 2017-03-07 NOTE — Progress Notes (Signed)
Patient Active Problem List   Diagnosis Date Noted  . HIV (human immunodeficiency virus infection) (HCC) 07/25/2010    Priority: High    Class: Diagnosis of  . 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 Chlamydia 02/21/2017  . Obesity 12/06/2015  . Elevated blood-pressure reading without diagnosis of hypertension 12/06/2015  . Hyperglycemia 12/06/2015  . Esophageal ulcer 11/21/2010    Class: History of  . Seborrhea 08/17/2010    Class: History of  . Periodontal disease 08/09/2010    Class: History of  . VENEREAL WART 01/03/2010    Class: Question of  . RECTAL BLEEDING 01/03/2010    Class: History of    Patient's Medications  New Prescriptions   No medications on file  Previous Medications   AZITHROMYCIN (ZITHROMAX) 500 MG TABLET    Take 2 tablets (1,000 mg total) by mouth daily.   DOLUTEGRAVIR (TIVICAY) 50 MG TABLET    Take 1 tablet (50 mg total) by mouth daily.   EMTRICITABINE-TENOFOVIR AF (DESCOVY) 200-25 MG TABLET    Take 1 tablet by mouth daily.   IBUPROFEN (ADVIL,MOTRIN) 100 MG TABLET    Take 100 mg by mouth every 6 (six) hours as needed.  Modified Medications   No medications on file  Discontinued Medications   No medications on file    Subjective: [name] is in for his routine HIV follow-up visit.  He has had no problems obtaining, taking or tolerating his Descovy and Tivicay.  He denies missing any doses since his last visit.  He recently came in for lab testing before this visit.  He tested positive for chlamydia and was treated with azithromycin.  He and his wife were seen by her counselor.  He tells me that he finally disclosed to his wife that he had been unfaithful.  He says that she has been tested.  Review of Systems: Review of Systems  Constitutional: Negative for chills, diaphoresis, fever, malaise/fatigue and weight loss.  HENT: Negative for sore throat.   Respiratory: Negative for cough, sputum production and shortness of breath.     Cardiovascular: Negative for chest pain.  Gastrointestinal: Negative for abdominal pain, diarrhea, heartburn, nausea and vomiting.  Genitourinary: Negative for dysuria and frequency.  Musculoskeletal: Negative for joint pain and myalgias.  Skin: Negative for rash.  Neurological: Negative for dizziness and headaches.  Psychiatric/Behavioral: Negative for depression and substance abuse. The patient is not nervous/anxious.     Past Medical History:  Diagnosis Date  . GERD (gastroesophageal reflux disease)   . HIV disease (HCC)   . Ulcer (HCC)     Social History   Tobacco Use  . Smoking status: Never Smoker  . Smokeless tobacco: Never Used  Substance Use Topics  . Alcohol use: Yes    Alcohol/week: 0.0 oz    Comment: occasional  . Drug use: No    Family History  Problem Relation Age of Onset  . Heart attack Father 4968  . Colon cancer Neg Hx     No Known Allergies  Health Maintenance  Topic Date Due  . COLONOSCOPY  08/22/2013  . TETANUS/TDAP  03/08/2016  . INFLUENZA VACCINE  09/06/2016  . Hepatitis C Screening  Completed  . HIV Screening  Completed    Objective:  Vitals:   03/07/17 1608  BP: (!) 166/94  Pulse: 76  Temp: 98.1 F (36.7 C)  TempSrc: Oral  Weight: 248 lb (112.5 kg)  Height: 6\' 1"  (1.854 m)   Body  mass index is 32.72 kg/m.  Physical Exam  Constitutional: He is oriented to person, place, and time.  He is in good spirits.  HENT:  Mouth/Throat: No oropharyngeal exudate.  Eyes: Conjunctivae are normal.  Cardiovascular: Normal rate and regular rhythm.  No murmur heard. Pulmonary/Chest: Effort normal and breath sounds normal.  Abdominal: Soft. He exhibits no mass. There is no tenderness.  Musculoskeletal: Normal range of motion.  Neurological: He is alert and oriented to person, place, and time.  Skin: No rash noted.  Psychiatric: Mood and affect normal.    Lab Results Lab Results  Component Value Date   WBC 3.6 (L) 02/15/2017   HGB  14.4 02/15/2017   HCT 42.2 02/15/2017   MCV 90.0 02/15/2017   PLT 191 02/15/2017    Lab Results  Component Value Date   CREATININE 1.20 02/15/2017   BUN 13 02/15/2017   NA 139 02/15/2017   K 4.8 02/15/2017   CL 103 02/15/2017   CO2 27 02/15/2017    Lab Results  Component Value Date   ALT 17 02/15/2017   AST 13 02/15/2017   ALKPHOS 70 10/12/2015   BILITOT 1.0 02/15/2017    Lab Results  Component Value Date   CHOL 128 10/12/2015   HDL 50 10/12/2015   LDLCALC 67 10/12/2015   TRIG 56 10/12/2015   CHOLHDL 2.6 10/12/2015   Lab Results  Component Value Date   LABRPR REACTIVE (A) 02/15/2017   RPRTITER 1:1 (H) 02/15/2017   HIV 1 RNA Quant  Date Value  02/15/2017 <20 Copies/mL  10/12/2015 <20 copies/mL  08/18/2014 <20 copies/mL   HIV-1 RNA Viral Load (no units)  Date Value  08/05/2012 <50   CD4 (no units)  Date Value  08/05/2012 237   CD4 T Cell Abs (/uL)  Date Value  02/15/2017 340 (L)  10/12/2015 300 (L)  08/18/2014 340 (L)     Problem List Items Addressed This Visit      High   HIV (human immunodeficiency virus infection) (HCC)    His infection remains under excellent, long-term control.  He will continue Descovy and Tivicay.  I talked to him about the importance of limiting the number of partners he has, careful partner selection and condom use.  He will follow-up after lab work in 1 year      Relevant Orders   CBC   T-helper cell (CD4)- ([facility] clinic only)   Comprehensive metabolic panel   Lipid panel   RPR   HIV 1 RNA quant-no reflex-bld     Unprioritized   Chlamydia    He has been treated.      Late latent syphilis    He was treated for syphilis in 2014.  His test is positive again at a very low titer.  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      Relevant Medications   penicillin g benzathine (BICILLIN LA) 1200000 UNIT/2ML injection 1.2 Million Units   penicillin g benzathine (BICILLIN LA) 1200000 UNIT/2ML injection 1.2  Million Units        Cliffton Asters, MD [facility] for Infectious Disease [facility] [phone] pager   [phone] cell 03/07/2017, 5:22 PM

## 2017-03-07 NOTE — Assessment & Plan Note (Signed)
His infection remains under excellent, long-term control.  He will continue Descovy and Tivicay.  I talked to him about the importance of limiting the number of partners he has, careful partner selection and condom use.  He will follow-up after lab work in 1 year

## 2017-03-07 NOTE — Assessment & Plan Note (Signed)
He was treated for syphilis in 2014.  His test is positive again at a very low titer.  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

## 2017-03-14 ENCOUNTER — Ambulatory Visit (INDEPENDENT_AMBULATORY_CARE_PROVIDER_SITE_OTHER): Payer: BLUE CROSS/BLUE SHIELD | Admitting: Behavioral Health

## 2017-03-14 DIAGNOSIS — B20 Human immunodeficiency virus [HIV] disease: Secondary | ICD-10-CM

## 2017-03-14 DIAGNOSIS — A528 Late syphilis, latent: Secondary | ICD-10-CM | POA: Diagnosis not present

## 2017-03-21 ENCOUNTER — Ambulatory Visit (INDEPENDENT_AMBULATORY_CARE_PROVIDER_SITE_OTHER): Payer: BLUE CROSS/BLUE SHIELD

## 2017-03-21 DIAGNOSIS — H5213 Myopia, bilateral: Secondary | ICD-10-CM | POA: Diagnosis not present

## 2017-03-21 DIAGNOSIS — A528 Late syphilis, latent: Secondary | ICD-10-CM

## 2017-03-21 DIAGNOSIS — H524 Presbyopia: Secondary | ICD-10-CM | POA: Diagnosis not present

## 2017-03-21 DIAGNOSIS — B20 Human immunodeficiency virus [HIV] disease: Secondary | ICD-10-CM | POA: Diagnosis not present

## 2017-03-21 DIAGNOSIS — A539 Syphilis, unspecified: Secondary | ICD-10-CM

## 2017-03-21 DIAGNOSIS — H52203 Unspecified astigmatism, bilateral: Secondary | ICD-10-CM | POA: Diagnosis not present

## 2017-04-19 ENCOUNTER — Other Ambulatory Visit: Payer: Self-pay | Admitting: Internal Medicine

## 2017-04-19 DIAGNOSIS — Z21 Asymptomatic human immunodeficiency virus [HIV] infection status: Secondary | ICD-10-CM

## 2017-04-19 DIAGNOSIS — B20 Human immunodeficiency virus [HIV] disease: Secondary | ICD-10-CM

## 2017-04-23 ENCOUNTER — Other Ambulatory Visit: Payer: Self-pay | Admitting: Internal Medicine

## 2017-04-23 DIAGNOSIS — B20 Human immunodeficiency virus [HIV] disease: Secondary | ICD-10-CM

## 2018-02-21 ENCOUNTER — Other Ambulatory Visit: Payer: BLUE CROSS/BLUE SHIELD

## 2018-03-01 ENCOUNTER — Other Ambulatory Visit: Payer: BLUE CROSS/BLUE SHIELD

## 2018-03-01 DIAGNOSIS — B20 Human immunodeficiency virus [HIV] disease: Secondary | ICD-10-CM

## 2018-03-01 LAB — T-HELPER CELL (CD4) - (RCID CLINIC ONLY)
CD4 T CELL ABS: 320 /uL — AB (ref 400–2700)
CD4 T CELL HELPER: 18 % — AB (ref 33–55)

## 2018-03-05 LAB — CBC
HCT: 44 % (ref 38.5–50.0)
HEMOGLOBIN: 14.9 g/dL (ref 13.2–17.1)
MCH: 31.1 pg (ref 27.0–33.0)
MCHC: 33.9 g/dL (ref 32.0–36.0)
MCV: 91.9 fL (ref 80.0–100.0)
MPV: 10.6 fL (ref 7.5–12.5)
PLATELETS: 194 10*3/uL (ref 140–400)
RBC: 4.79 10*6/uL (ref 4.20–5.80)
RDW: 12.4 % (ref 11.0–15.0)
WBC: 3.2 10*3/uL — ABNORMAL LOW (ref 3.8–10.8)

## 2018-03-05 LAB — COMPREHENSIVE METABOLIC PANEL
AG Ratio: 1.4 (calc) (ref 1.0–2.5)
ALBUMIN MSPROF: 4.3 g/dL (ref 3.6–5.1)
ALKALINE PHOSPHATASE (APISO): 59 U/L (ref 40–115)
ALT: 15 U/L (ref 9–46)
AST: 13 U/L (ref 10–35)
BUN: 14 mg/dL (ref 7–25)
CHLORIDE: 106 mmol/L (ref 98–110)
CO2: 28 mmol/L (ref 20–32)
CREATININE: 1.27 mg/dL (ref 0.70–1.33)
Calcium: 9.5 mg/dL (ref 8.6–10.3)
GLOBULIN: 3 g/dL (ref 1.9–3.7)
Glucose, Bld: 106 mg/dL — ABNORMAL HIGH (ref 65–99)
POTASSIUM: 4.5 mmol/L (ref 3.5–5.3)
Sodium: 139 mmol/L (ref 135–146)
Total Bilirubin: 0.9 mg/dL (ref 0.2–1.2)
Total Protein: 7.3 g/dL (ref 6.1–8.1)

## 2018-03-05 LAB — LIPID PANEL
CHOL/HDL RATIO: 3.2 (calc) (ref <5.0)
Cholesterol: 142 mg/dL (ref <200)
HDL: 44 mg/dL (ref >40)
LDL Cholesterol (Calc): 85 mg/dL (calc)
NON-HDL CHOLESTEROL (CALC): 98 mg/dL (ref <130)
Triglycerides: 51 mg/dL (ref <150)

## 2018-03-05 LAB — RPR: RPR: REACTIVE — AB

## 2018-03-05 LAB — FLUORESCENT TREPONEMAL AB(FTA)-IGG-BLD: FLUORESCENT TREPONEMAL ABS: REACTIVE — AB

## 2018-03-05 LAB — HIV-1 RNA QUANT-NO REFLEX-BLD
HIV 1 RNA QUANT: NOT DETECTED {copies}/mL
HIV-1 RNA Quant, Log: <1.3 Log copies/mL

## 2018-03-05 LAB — RPR TITER: RPR Titer: 1:1 {titer} — ABNORMAL HIGH

## 2018-03-07 ENCOUNTER — Ambulatory Visit: Payer: BLUE CROSS/BLUE SHIELD | Admitting: Internal Medicine

## 2018-04-09 ENCOUNTER — Ambulatory Visit: Payer: BLUE CROSS/BLUE SHIELD | Admitting: Internal Medicine

## 2018-04-15 ENCOUNTER — Ambulatory Visit (INDEPENDENT_AMBULATORY_CARE_PROVIDER_SITE_OTHER): Payer: BLUE CROSS/BLUE SHIELD | Admitting: Internal Medicine

## 2018-04-15 ENCOUNTER — Encounter: Payer: Self-pay | Admitting: Internal Medicine

## 2018-04-15 VITALS — BP 156/92 | HR 86 | Temp 98.0°F | Wt 242.0 lb

## 2018-04-15 DIAGNOSIS — Z23 Encounter for immunization: Secondary | ICD-10-CM | POA: Diagnosis not present

## 2018-04-15 DIAGNOSIS — Z21 Asymptomatic human immunodeficiency virus [HIV] infection status: Secondary | ICD-10-CM

## 2018-04-15 NOTE — Progress Notes (Signed)
Patient Active Problem List   Diagnosis Date Noted  . HIV (human immunodeficiency virus infection) (HCC) 07/25/2010    Priority: High    Class: Diagnosis of  . 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 Chlamydia 02/21/2017  . Obesity 12/06/2015  . Elevated blood-pressure reading without diagnosis of hypertension 12/06/2015  . Hyperglycemia 12/06/2015  . Esophageal ulcer 11/21/2010    Class: History of  . Seborrhea 08/17/2010    Class: History of  . Periodontal disease 08/09/2010    Class: History of  . VENEREAL WART 01/03/2010    Class: Question of  . RECTAL BLEEDING 01/03/2010    Class: History of    Patient's Medications  New Prescriptions   No medications on file  Previous Medications   AZITHROMYCIN (ZITHROMAX) 500 MG TABLET    Take 2 tablets (1,000 mg total) by mouth daily.   DESCOVY 200-25 MG TABLET    TAKE 1 TABLET BY MOUTH DAILY.   DOLUTEGRAVIR (TIVICAY) 50 MG TABLET    Take 1 tablet (50 mg total) by mouth daily.   IBUPROFEN (ADVIL,MOTRIN) 100 MG TABLET    Take 100 mg by mouth every 6 (six) hours as needed.  Modified Medications   No medications on file  Discontinued Medications   EMTRICITABINE-TENOFOVIR AF (DESCOVY) 200-25 MG TABLET    Take 1 tablet by mouth daily.   TIVICAY 50 MG TABLET    TAKE 1 TABLET BY MOUTH DAILY.    Subjective: Francis Pacheco is in for his routine HIV follow-up visit.  He has had no problems obtaining, taking or tolerating his Descovy or Tivicay.  He does not recall ever having missed a single dose.  He is feeling well.  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  Review of Systems: Review of Systems  Constitutional: Negative for chills, diaphoresis, fever, malaise/fatigue and weight loss.  HENT: Negative for sore throat.   Respiratory: Negative for cough, sputum production and shortness of breath.   Cardiovascular: Negative for chest pain.  Gastrointestinal: Negative for abdominal pain, diarrhea, heartburn, nausea  and vomiting.  Genitourinary: Negative for dysuria and frequency.  Musculoskeletal: Negative for joint pain and myalgias.  Skin: Negative for rash.  Neurological: Negative for dizziness and headaches.  Psychiatric/Behavioral: Negative for depression and substance abuse. The patient is not nervous/anxious.     Past Medical History:  Diagnosis Date  . GERD (gastroesophageal reflux disease)   . HIV disease (HCC)   . Ulcer     Social History   Tobacco Use  . Smoking status: Never Smoker  . Smokeless tobacco: Never Used  Substance Use Topics  . Alcohol use: Yes    Alcohol/week: 0.0 standard drinks    Comment: occasional  . Drug use: No    Family History  Problem Relation Age of Onset  . Heart attack Father 25  . Colon cancer Neg Hx     No Known Allergies  Health Maintenance  Topic Date Due  . COLONOSCOPY  08/22/2013  . TETANUS/TDAP  03/08/2016  . INFLUENZA VACCINE  09/06/2017  . Hepatitis C Screening  Completed  . HIV Screening  Completed    Objective:  Vitals:   04/15/18 1525  BP: (!) 156/92  Pulse: 86  Temp: 98 F (36.7 C)  Weight: 242 lb (109.8 kg)   Body mass index is 31.93 kg/m.  Physical Exam HENT:     Mouth/Throat:     Pharynx: No oropharyngeal exudate.  Eyes:     Conjunctiva/sclera: Conjunctivae normal.  Cardiovascular:     Rate and Rhythm: Normal rate and regular rhythm.     Heart sounds: No murmur.  Pulmonary:     Effort: Pulmonary effort is normal.     Breath sounds: Normal breath sounds.  Abdominal:     Palpations: Abdomen is soft. There is no mass.     Tenderness: There is no abdominal tenderness.  Musculoskeletal: Normal range of motion.  Skin:    Findings: No rash.  Neurological:     Mental Status: He is alert and oriented to person, place, and time.     Lab Results Lab Results  Component Value Date   WBC 3.2 (L) 03/01/2018   HGB 14.9 03/01/2018   HCT 44.0 03/01/2018   MCV 91.9 03/01/2018   PLT 194 03/01/2018    Lab  Results  Component Value Date   CREATININE 1.27 03/01/2018   BUN 14 03/01/2018   NA 139 03/01/2018   K 4.5 03/01/2018   CL 106 03/01/2018   CO2 28 03/01/2018    Lab Results  Component Value Date   ALT 15 03/01/2018   AST 13 03/01/2018   ALKPHOS 70 10/12/2015   BILITOT 0.9 03/01/2018    Lab Results  Component Value Date   CHOL 142 03/01/2018   HDL 44 03/01/2018   LDLCALC 85 03/01/2018   TRIG 51 03/01/2018   CHOLHDL 3.2 03/01/2018   Lab Results  Component Value Date   LABRPR REACTIVE (A) 03/01/2018   RPRTITER 1:1 (H) 03/01/2018   HIV 1 RNA Quant  Date Value  03/01/2018 <20 NOT DETECTED copies/mL  02/15/2017 <20 Copies/mL  10/12/2015 <20 copies/mL   HIV-1 RNA Viral Load (no units)  Date Value  08/05/2012 <50   CD4 (no units)  Date Value  08/05/2012 237   CD4 T Cell Abs (/uL)  Date Value  03/01/2018 320 (L)  02/15/2017 340 (L)  10/12/2015 300 (L)     Problem List Items Addressed This Visit      High   HIV (human immunodeficiency virus infection) (HCC)    His infection remains under perfect, long-term control.  He will continue with his current regimen and follow-up after lab work in 1 year.      Relevant Orders   CBC   T-helper cell (CD4)- ([facility] clinic only)   Comprehensive metabolic panel   Lipid panel   RPR   HIV-1 RNA quant-no reflex-bld        Cliffton Asters, MD [facility] for Infectious Disease [facility] [phone] pager   [phone] cell 04/15/2018, 3:48 PM

## 2018-04-15 NOTE — Assessment & Plan Note (Signed)
His infection remains under perfect, long-term control.  He will continue with his current regimen and follow-up after lab work in 1 year.

## 2018-05-08 ENCOUNTER — Other Ambulatory Visit: Payer: Self-pay | Admitting: Internal Medicine

## 2018-05-08 DIAGNOSIS — B20 Human immunodeficiency virus [HIV] disease: Secondary | ICD-10-CM

## 2018-10-25 ENCOUNTER — Other Ambulatory Visit: Payer: Self-pay | Admitting: Internal Medicine

## 2018-10-25 DIAGNOSIS — Z21 Asymptomatic human immunodeficiency virus [HIV] infection status: Secondary | ICD-10-CM

## 2018-10-25 DIAGNOSIS — B20 Human immunodeficiency virus [HIV] disease: Secondary | ICD-10-CM

## 2019-03-20 ENCOUNTER — Other Ambulatory Visit: Payer: Self-pay | Admitting: Internal Medicine

## 2019-03-20 ENCOUNTER — Other Ambulatory Visit: Payer: Self-pay

## 2019-03-20 DIAGNOSIS — B20 Human immunodeficiency virus [HIV] disease: Secondary | ICD-10-CM

## 2019-03-20 MED ORDER — DESCOVY 200-25 MG PO TABS: 1.0000 | ORAL_TABLET | Freq: Every day | ORAL | 1 refills | Status: DC

## 2019-03-20 MED ORDER — TIVICAY 50 MG PO TABS: 50.0000 mg | ORAL_TABLET | Freq: Every day | ORAL | 1 refills | Status: DC

## 2019-04-01 ENCOUNTER — Other Ambulatory Visit: Payer: BC Managed Care – PPO

## 2019-04-01 ENCOUNTER — Other Ambulatory Visit: Payer: Self-pay

## 2019-04-01 DIAGNOSIS — Z21 Asymptomatic human immunodeficiency virus [HIV] infection status: Secondary | ICD-10-CM

## 2019-04-02 LAB — T-HELPER CELL (CD4) - (RCID CLINIC ONLY)
CD4 % Helper T Cell: 19 % — ABNORMAL LOW (ref 33–65)
CD4 T Cell Abs: 429 /uL (ref 400–1790)

## 2019-04-05 LAB — COMPREHENSIVE METABOLIC PANEL
AG Ratio: 1.7 (calc) (ref 1.0–2.5)
ALT: 23 U/L (ref 9–46)
AST: 17 U/L (ref 10–35)
Albumin: 4.5 g/dL (ref 3.6–5.1)
Alkaline phosphatase (APISO): 66 U/L (ref 35–144)
BUN: 14 mg/dL (ref 7–25)
CO2: 26 mmol/L (ref 20–32)
Calcium: 9.6 mg/dL (ref 8.6–10.3)
Chloride: 105 mmol/L (ref 98–110)
Creat: 1.26 mg/dL (ref 0.70–1.33)
Globulin: 2.7 g/dL (calc) (ref 1.9–3.7)
Glucose, Bld: 101 mg/dL — ABNORMAL HIGH (ref 65–99)
Potassium: 4.4 mmol/L (ref 3.5–5.3)
Sodium: 139 mmol/L (ref 135–146)
Total Bilirubin: 0.8 mg/dL (ref 0.2–1.2)
Total Protein: 7.2 g/dL (ref 6.1–8.1)

## 2019-04-05 LAB — CBC
HCT: 41.5 % (ref 38.5–50.0)
Hemoglobin: 14.4 g/dL (ref 13.2–17.1)
MCH: 31.9 pg (ref 27.0–33.0)
MCHC: 34.7 g/dL (ref 32.0–36.0)
MCV: 92 fL (ref 80.0–100.0)
MPV: 10.6 fL (ref 7.5–12.5)
Platelets: 190 10*3/uL (ref 140–400)
RBC: 4.51 10*6/uL (ref 4.20–5.80)
RDW: 12.3 % (ref 11.0–15.0)
WBC: 4.3 10*3/uL (ref 3.8–10.8)

## 2019-04-05 LAB — LIPID PANEL
Cholesterol: 150 mg/dL (ref <200)
HDL: 48 mg/dL (ref >=40)
LDL Cholesterol (Calc): 84 mg/dL (calc)
Non-HDL Cholesterol (Calc): 102 mg/dL (calc) (ref <130)
Total CHOL/HDL Ratio: 3.1 (calc) (ref <5.0)
Triglycerides: 87 mg/dL (ref <150)

## 2019-04-05 LAB — HIV-1 RNA QUANT-NO REFLEX-BLD
HIV 1 RNA Quant: <20 copies/mL
HIV-1 RNA Quant, Log: <1.3 Log copies/mL

## 2019-04-05 LAB — RPR TITER: RPR Titer: 1:1 {titer} — ABNORMAL HIGH

## 2019-04-05 LAB — FLUORESCENT TREPONEMAL AB(FTA)-IGG-BLD: Fluorescent Treponemal ABS: REACTIVE — AB

## 2019-04-05 LAB — RPR: RPR Ser Ql: REACTIVE — AB

## 2019-04-09 ENCOUNTER — Other Ambulatory Visit: Payer: Self-pay | Admitting: Internal Medicine

## 2019-04-09 DIAGNOSIS — B20 Human immunodeficiency virus [HIV] disease: Secondary | ICD-10-CM

## 2019-04-15 ENCOUNTER — Encounter: Payer: Self-pay | Admitting: Internal Medicine

## 2019-04-15 ENCOUNTER — Ambulatory Visit (INDEPENDENT_AMBULATORY_CARE_PROVIDER_SITE_OTHER): Payer: BC Managed Care – PPO | Admitting: Internal Medicine

## 2019-04-15 ENCOUNTER — Other Ambulatory Visit: Payer: Self-pay

## 2019-04-15 ENCOUNTER — Telehealth: Payer: Self-pay | Admitting: Pharmacy Technician

## 2019-04-15 DIAGNOSIS — R030 Elevated blood-pressure reading, without diagnosis of hypertension: Secondary | ICD-10-CM

## 2019-04-15 DIAGNOSIS — Z21 Asymptomatic human immunodeficiency virus [HIV] infection status: Secondary | ICD-10-CM

## 2019-04-15 DIAGNOSIS — B20 Human immunodeficiency virus [HIV] disease: Secondary | ICD-10-CM

## 2019-04-15 DIAGNOSIS — A528 Late syphilis, latent: Secondary | ICD-10-CM

## 2019-04-15 MED ORDER — DESCOVY 200-25 MG PO TABS: 1.0000 | ORAL_TABLET | Freq: Every day | ORAL | 11 refills | Status: DC

## 2019-04-15 MED ORDER — TIVICAY 50 MG PO TABS: 50.0000 mg | ORAL_TABLET | Freq: Every day | ORAL | 11 refills | Status: DC

## 2019-04-15 NOTE — Telephone Encounter (Signed)
[facility] Patient Advocate Encounter   Was successful in obtaining a [employer] copay card for Descovy.  This copay card will make the patients copay $0.  I have sent the information to Francis Pacheco  RxBin: [id number] PCN: ACCESS Member ID: [id number] Group ID: [id number]    Was successful in obtaining a [employer] copay card for Tivicay.  This copay card will make the patients copay $0.  I have sent the information to Francis Pacheco  RxBin: [id number] PCN: LOYALTY Member ID: [id number] Group ID: [id number]    Francis Pacheco. Francis Pacheco CPhT Specialty Pharmacy Patient [facility] Phone: [phone] Fax:  [phone]

## 2019-04-15 NOTE — Progress Notes (Signed)
Patient Active Problem List   Diagnosis Date Noted  . HIV (human immunodeficiency virus infection) ([place]) 07/25/2010    Priority: High    Class: Diagnosis of  . 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 Chlamydia 02/21/2017  . Obesity 12/06/2015  . Elevated blood-pressure reading without diagnosis of hypertension 12/06/2015  . Hyperglycemia 12/06/2015  . Esophageal ulcer 11/21/2010    Class: History of  . Seborrhea 08/17/2010    Class: History of  . Periodontal disease 08/09/2010    Class: History of  . VENEREAL WART 01/03/2010    Class: Question of  . RECTAL BLEEDING 01/03/2010    Class: History of    Patient's Medications  New Prescriptions   No medications on file  Previous Medications   AZITHROMYCIN (ZITHROMAX) 500 MG TABLET    Take 2 tablets (1,000 mg total) by mouth daily.   IBUPROFEN (ADVIL,MOTRIN) 100 MG TABLET    Take 100 mg by mouth every 6 (six) hours as needed.  Modified Medications   Modified Medication Previous Medication   DOLUTEGRAVIR (TIVICAY) 50 MG TABLET dolutegravir (TIVICAY) 50 MG tablet      Take 1 tablet (50 mg total) by mouth daily.    Take 1 tablet (50 mg total) by mouth daily.   EMTRICITABINE-TENOFOVIR AF (DESCOVY) 200-25 MG TABLET emtricitabine-tenofovir AF (DESCOVY) 200-25 MG tablet      Take 1 tablet by mouth daily.    Take 1 tablet by mouth daily.  Discontinued Medications   No medications on file    Subjective: Francis Pacheco is in for his routine HIV follow-up visit.  He has not had any problems obtaining, taking or tolerating his Descovy or Tivicay.  He takes them each morning and denies missing any doses.  He has been working throughout the [employer] pandemic.  He says that he feels safe and that people are wearing their facemasks.  He wants to know what I think about taking the Covid vaccine.  He says that he has not been able to lose any weight.  He has not seen his PCP in almost 3 years.  Review of Systems: Review of Systems    Constitutional: Negative for chills, diaphoresis, fever, malaise/fatigue and weight loss.  HENT: Negative for sore throat.   Respiratory: Negative for cough, sputum production and shortness of breath.   Cardiovascular: Negative for chest pain.  Gastrointestinal: Negative for abdominal pain, diarrhea, heartburn, nausea and vomiting.  Genitourinary: Negative for dysuria and frequency.  Musculoskeletal: Negative for joint pain and myalgias.  Skin: Negative for rash.  Neurological: Negative for dizziness and headaches.  Psychiatric/Behavioral: Negative for depression. The patient is not nervous/anxious.     Past Medical History:  Diagnosis Date  . GERD (gastroesophageal reflux disease)   . HIV disease ([place])   . Ulcer     Social History   Tobacco Use  . Smoking status: Never Smoker  . Smokeless tobacco: Never Used  Substance Use Topics  . Alcohol use: Yes    Alcohol/week: 0.0 standard drinks    Comment: occasional  . Drug use: No    Family History  Problem Relation Age of Onset  . Heart attack Father 36  . Colon cancer Neg Hx     No Known Allergies  Health Maintenance  Topic Date Due  . COLONOSCOPY  08/22/2013  . TETANUS/TDAP  03/08/2016  . INFLUENZA VACCINE  09/07/2018  . Hepatitis C Screening  Completed  . HIV Screening  Completed  Objective:  Vitals:   04/15/19 1545  BP: (!) 157/90  Pulse: (!) 114  Temp: 98.2 F (36.8 C)  TempSrc: Oral  Weight: 249 lb (112.9 kg)   Body mass index is 32.85 kg/m.  Physical Exam Constitutional:      Comments: He is in his usual good spirits  Cardiovascular:     Rate and Rhythm: Normal rate and regular rhythm.     Heart sounds: No murmur.  Pulmonary:     Effort: Pulmonary effort is normal.     Breath sounds: Normal breath sounds.  Abdominal:     Tenderness: There is no abdominal tenderness.  Psychiatric:        Mood and Affect: Mood normal.     Lab Results Lab Results  Component Value Date   WBC 4.3  04/01/2019   HGB 14.4 04/01/2019   HCT 41.5 04/01/2019   MCV 92.0 04/01/2019   PLT 190 04/01/2019    Lab Results  Component Value Date   CREATININE 1.26 04/01/2019   BUN 14 04/01/2019   NA 139 04/01/2019   K 4.4 04/01/2019   CL 105 04/01/2019   CO2 26 04/01/2019    Lab Results  Component Value Date   ALT 23 04/01/2019   AST 17 04/01/2019   ALKPHOS 70 10/12/2015   BILITOT 0.8 04/01/2019    Lab Results  Component Value Date   CHOL 150 04/01/2019   HDL 48 04/01/2019   LDLCALC 84 04/01/2019   TRIG 87 04/01/2019   CHOLHDL 3.1 04/01/2019   Lab Results  Component Value Date   LABRPR REACTIVE (A) 04/01/2019   RPRTITER 1:1 (H) 04/01/2019   HIV 1 RNA Quant  Date Value  04/01/2019 <20 NOT DETECTED copies/mL  03/01/2018 <20 NOT DETECTED copies/mL  02/15/2017 <20 Copies/mL   HIV-1 RNA Viral Load (no units)  Date Value  08/05/2012 <50   CD4 (no units)  Date Value  08/05/2012 237   CD4 T Cell Abs (/uL)  Date Value  04/01/2019 429  03/01/2018 320 (L)  02/15/2017 340 (L)     Problem List Items Addressed This Visit      High   HIV (human immunodeficiency virus infection) (HCC)    His infection remains under excellent, long-term control.  He will continue Descovy and Tivicay and follow-up after lab work in 1 year.  I told him that definitely recommended that he receive the Covid vaccine as soon as he is eligible.      Relevant Medications   emtricitabine-tenofovir AF (DESCOVY) 200-25 MG tablet   dolutegravir (TIVICAY) 50 MG tablet   Other Relevant Orders   CBC   T-helper cell (CD4)- ([facility] clinic only)   Comprehensive metabolic panel   Lipid panel   RPR   HIV-1 RNA quant-no reflex-bld     Unprioritized   Late latent syphilis    His RPR remains reactive but low and stable at 1:1.  I believe that this represents serofast status.  He does not need retreatment.      Relevant Medications   emtricitabine-tenofovir AF (DESCOVY) 200-25 MG tablet   dolutegravir  (TIVICAY) 50 MG tablet   Elevated blood-pressure reading without diagnosis of hypertension    His blood pressure and weight remain above goal.  I encouraged him to get back with his PCP as soon as possible.       Other Visit Diagnoses    Human immunodeficiency virus (HIV) disease (HCC)       Relevant Medications  emtricitabine-tenofovir AF (DESCOVY) 200-25 MG tablet   dolutegravir (TIVICAY) 50 MG tablet        Cliffton Asters, MD [facility] for Infectious Disease [facility] [phone] pager   [phone] cell 04/15/2019, 4:08 PM

## 2019-04-15 NOTE — Assessment & Plan Note (Signed)
His blood pressure and weight remain above goal.  I encouraged him to get back with his PCP as soon as possible.

## 2019-04-15 NOTE — Assessment & Plan Note (Signed)
His infection remains under excellent, long-term control.  He will continue Descovy and Tivicay and follow-up after lab work in 1 year.  I told him that definitely recommended that he receive the Covid vaccine as soon as he is eligible.

## 2019-04-15 NOTE — Assessment & Plan Note (Signed)
His RPR remains reactive but low and stable at 1:1.  I believe that this represents serofast status.  He does not need retreatment.

## 2019-05-06 ENCOUNTER — Other Ambulatory Visit: Payer: Self-pay | Admitting: Internal Medicine

## 2019-05-06 DIAGNOSIS — Z21 Asymptomatic human immunodeficiency virus [HIV] infection status: Secondary | ICD-10-CM

## 2019-05-06 DIAGNOSIS — B20 Human immunodeficiency virus [HIV] disease: Secondary | ICD-10-CM

## 2019-10-23 ENCOUNTER — Other Ambulatory Visit: Payer: Self-pay | Admitting: Internal Medicine

## 2019-10-23 ENCOUNTER — Other Ambulatory Visit: Payer: Self-pay

## 2019-10-23 DIAGNOSIS — B20 Human immunodeficiency virus [HIV] disease: Secondary | ICD-10-CM

## 2019-10-23 DIAGNOSIS — Z21 Asymptomatic human immunodeficiency virus [HIV] infection status: Secondary | ICD-10-CM

## 2019-10-23 MED ORDER — TIVICAY 50 MG PO TABS: 50.0000 mg | ORAL_TABLET | Freq: Every day | ORAL | 5 refills | Status: DC

## 2019-10-23 MED ORDER — DESCOVY 200-25 MG PO TABS: 1.0000 | ORAL_TABLET | Freq: Every day | ORAL | 5 refills | Status: DC

## 2020-05-14 ENCOUNTER — Other Ambulatory Visit: Payer: Self-pay | Admitting: Internal Medicine

## 2020-05-14 ENCOUNTER — Other Ambulatory Visit: Payer: Self-pay

## 2020-05-14 DIAGNOSIS — Z21 Asymptomatic human immunodeficiency virus [HIV] infection status: Secondary | ICD-10-CM

## 2020-05-14 DIAGNOSIS — B20 Human immunodeficiency virus [HIV] disease: Secondary | ICD-10-CM

## 2020-05-14 MED ORDER — TIVICAY 50 MG PO TABS: 50.0000 mg | ORAL_TABLET | Freq: Every day | ORAL | 0 refills | Status: DC

## 2020-05-14 MED ORDER — DESCOVY 200-25 MG PO TABS: 1.0000 | ORAL_TABLET | Freq: Every day | ORAL | 0 refills | Status: DC

## 2020-05-27 ENCOUNTER — Other Ambulatory Visit: Payer: BC Managed Care – PPO

## 2020-05-27 ENCOUNTER — Other Ambulatory Visit (HOSPITAL_COMMUNITY): Payer: Self-pay

## 2020-05-27 ENCOUNTER — Telehealth: Payer: Self-pay

## 2020-05-27 ENCOUNTER — Other Ambulatory Visit: Payer: Self-pay | Admitting: Internal Medicine

## 2020-05-27 ENCOUNTER — Other Ambulatory Visit (HOSPITAL_COMMUNITY)
Admission: RE | Admit: 2020-05-27 | Discharge: 2020-05-27 | Disposition: A | Discharge status: Home / Self Care | Payer: BC Managed Care – PPO | Source: Ambulatory Visit | Attending: Internal Medicine | Admitting: Internal Medicine

## 2020-05-27 ENCOUNTER — Other Ambulatory Visit: Payer: Self-pay

## 2020-05-27 DIAGNOSIS — Z79899 Other long term (current) drug therapy: Secondary | ICD-10-CM

## 2020-05-27 DIAGNOSIS — Z113 Encounter for screening for infections with a predominantly sexual mode of transmission: Secondary | ICD-10-CM

## 2020-05-27 DIAGNOSIS — B20 Human immunodeficiency virus [HIV] disease: Secondary | ICD-10-CM | POA: Diagnosis not present

## 2020-05-27 DIAGNOSIS — Z21 Asymptomatic human immunodeficiency virus [HIV] infection status: Secondary | ICD-10-CM

## 2020-05-27 MED ORDER — BICTEGRAVIR-EMTRICITAB-TENOFOV 50-200-25 MG PO TABS: 1.0000 | ORAL_TABLET | Freq: Every day | ORAL | 11 refills | Status: DC

## 2020-05-27 NOTE — Telephone Encounter (Signed)
I cannot see that we have previously prescribed Biktarvy for him but I am certainly okay with him being on that now.  I have ordered that.  Please ask Quantez to bring previous bottles of Biktarvy and so we can verify who has prescribed it for him.  Thanks.

## 2020-05-27 NOTE — Telephone Encounter (Signed)
Opened in error

## 2020-05-27 NOTE — Progress Notes (Unsigned)
Patient made aware to bring current medication with him to his upcoming visit. Rx. Canceled with [facility] and sent to [facility] [address] per patient request.  Valarie Cones

## 2020-05-27 NOTE — Telephone Encounter (Signed)
Patient requesting refills and reports he only takes Radio [employer]. Unable to verify per last office note. Please advise if we can send in refills for Biktarvy.

## 2020-05-28 ENCOUNTER — Other Ambulatory Visit: Payer: Self-pay | Admitting: Internal Medicine

## 2020-05-28 DIAGNOSIS — Z21 Asymptomatic human immunodeficiency virus [HIV] infection status: Secondary | ICD-10-CM

## 2020-05-28 LAB — URINE CYTOLOGY ANCILLARY ONLY
Chlamydia: NEGATIVE
Comment: NEGATIVE
Comment: NORMAL
Neisseria Gonorrhea: NEGATIVE

## 2020-05-28 LAB — T-HELPER CELL (CD4) - (RCID CLINIC ONLY)
CD4 % Helper T Cell: 19 % — ABNORMAL LOW (ref 33–65)
CD4 T Cell Abs: 302 /uL — ABNORMAL LOW (ref 400–1790)

## 2020-05-31 LAB — CBC WITH DIFFERENTIAL/PLATELET
Absolute Monocytes: 267 cells/uL (ref 200–950)
Basophils Absolute: 9 cells/uL (ref 0–200)
Basophils Relative: 0.3 %
Eosinophils Absolute: 41 cells/uL (ref 15–500)
Eosinophils Relative: 1.4 %
HCT: 41.9 % (ref 38.5–50.0)
Hemoglobin: 13.9 g/dL (ref 13.2–17.1)
Lymphs Abs: 1511 cells/uL (ref 850–3900)
MCH: 30.8 pg (ref 27.0–33.0)
MCHC: 33.2 g/dL (ref 32.0–36.0)
MCV: 92.7 fL (ref 80.0–100.0)
MPV: 10.6 fL (ref 7.5–12.5)
Monocytes Relative: 9.2 %
Neutro Abs: 1073 cells/uL — ABNORMAL LOW (ref 1500–7800)
Neutrophils Relative %: 37 %
Platelets: 177 10*3/uL (ref 140–400)
RBC: 4.52 10*6/uL (ref 4.20–5.80)
RDW: 11.9 % (ref 11.0–15.0)
Total Lymphocyte: 52.1 %
WBC: 2.9 10*3/uL — ABNORMAL LOW (ref 3.8–10.8)

## 2020-05-31 LAB — HIV-1 RNA QUANT-NO REFLEX-BLD
HIV 1 RNA Quant: <20 Copies/mL — ABNORMAL HIGH
HIV-1 RNA Quant, Log: <1.3 Log cps/mL — ABNORMAL HIGH

## 2020-05-31 LAB — COMPLETE METABOLIC PANEL WITH GFR
AG Ratio: 2.1 (calc) (ref 1.0–2.5)
ALT: 20 U/L (ref 9–46)
AST: 15 U/L (ref 10–35)
Albumin: 4.4 g/dL (ref 3.6–5.1)
Alkaline phosphatase (APISO): 60 U/L (ref 35–144)
BUN: 14 mg/dL (ref 7–25)
CO2: 28 mmol/L (ref 20–32)
Calcium: 9.1 mg/dL (ref 8.6–10.3)
Chloride: 103 mmol/L (ref 98–110)
Creat: 0.93 mg/dL (ref 0.70–1.33)
GFR, Est African American: 106 mL/min/{1.73_m2} (ref >=60)
GFR, Est Non African American: 91 mL/min/{1.73_m2} (ref >=60)
Globulin: 2.1 g/dL (calc) (ref 1.9–3.7)
Glucose, Bld: 98 mg/dL (ref 65–99)
Potassium: 4.3 mmol/L (ref 3.5–5.3)
Sodium: 137 mmol/L (ref 135–146)
Total Bilirubin: 1 mg/dL (ref 0.2–1.2)
Total Protein: 6.5 g/dL (ref 6.1–8.1)

## 2020-05-31 LAB — RPR TITER: RPR Titer: 1:1 {titer} — ABNORMAL HIGH

## 2020-05-31 LAB — LIPID PANEL
Cholesterol: 134 mg/dL (ref <200)
HDL: 46 mg/dL (ref >=40)
LDL Cholesterol (Calc): 73 mg/dL (calc)
Non-HDL Cholesterol (Calc): 88 mg/dL (calc) (ref <130)
Total CHOL/HDL Ratio: 2.9 (calc) (ref <5.0)
Triglycerides: 66 mg/dL (ref <150)

## 2020-05-31 LAB — FLUORESCENT TREPONEMAL AB(FTA)-IGG-BLD: Fluorescent Treponemal ABS: REACTIVE — AB

## 2020-05-31 LAB — RPR: RPR Ser Ql: REACTIVE — AB

## 2020-06-01 NOTE — Telephone Encounter (Signed)
Patient already made aware to bring medications with him to his upcoming follow up visit.

## 2020-06-09 ENCOUNTER — Encounter: Payer: Self-pay | Admitting: Internal Medicine

## 2020-06-09 ENCOUNTER — Other Ambulatory Visit: Payer: Self-pay

## 2020-06-09 ENCOUNTER — Ambulatory Visit (INDEPENDENT_AMBULATORY_CARE_PROVIDER_SITE_OTHER): Payer: BC Managed Care – PPO | Admitting: Internal Medicine

## 2020-06-09 DIAGNOSIS — A528 Late syphilis, latent: Secondary | ICD-10-CM | POA: Diagnosis not present

## 2020-06-09 DIAGNOSIS — R030 Elevated blood-pressure reading, without diagnosis of hypertension: Secondary | ICD-10-CM

## 2020-06-09 DIAGNOSIS — Z21 Asymptomatic human immunodeficiency virus [HIV] infection status: Secondary | ICD-10-CM

## 2020-06-09 MED ORDER — BICTEGRAVIR-EMTRICITAB-TENOFOV 50-200-25 MG PO TABS: 1.0000 | ORAL_TABLET | Freq: Every day | ORAL | 11 refills | Status: DC

## 2020-06-09 NOTE — Progress Notes (Signed)
Patient Active Problem List   Diagnosis Date Noted  . HIV (human immunodeficiency virus infection) (HCC) 07/25/2010    Priority: High    Class: Diagnosis of  . 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 Chlamydia 02/21/2017  . Obesity 12/06/2015  . Elevated blood-pressure reading without diagnosis of hypertension 12/06/2015  . Hyperglycemia 12/06/2015  . Esophageal ulcer 11/21/2010    Class: History of  . Seborrhea 08/17/2010    Class: History of  . Periodontal disease 08/09/2010    Class: History of  . VENEREAL WART 01/03/2010    Class: Question of  . RECTAL BLEEDING 01/03/2010    Class: History of    Patient's Medications  New Prescriptions   No medications on file  Previous Medications   IBUPROFEN (ADVIL,MOTRIN) 100 MG TABLET    Take 100 mg by mouth every 6 (six) hours as needed.  Modified Medications   Modified Medication Previous Medication   BICTEGRAVIR-EMTRICITABINE-TENOFOVIR AF (BIKTARVY) 50-200-25 MG TABS TABLET bictegravir-emtricitabine-tenofovir AF (BIKTARVY) 50-200-25 MG TABS tablet      Take 1 tablet by mouth daily.    Take 1 tablet by mouth daily.  Discontinued Medications   AZITHROMYCIN (ZITHROMAX) 500 MG TABLET    Take 2 tablets (1,000 mg total) by mouth daily.   DOLUTEGRAVIR (TIVICAY) 50 MG TABLET    Take 1 tablet (50 mg total) by mouth daily.   EMTRICITABINE-TENOFOVIR AF (DESCOVY) 200-25 MG TABLET    Take 1 tablet by mouth daily.    Subjective: Francis Pacheco is in for his annual HIV follow-up visit.  I switched him to [place] following his visit last year.  He denies any problems obtaining, taking or tolerating it and does not recall missing any doses.  He did receive his first 2 doses of the Pfizer COVID-vaccine last fall.  He has not had a booster dose.  He has not seen his PCP.  Review of Systems: Review of Systems  Constitutional: Positive for weight loss. Negative for fever.  Respiratory: Negative for cough and shortness of breath.    Cardiovascular: Negative for chest pain.  Gastrointestinal: Negative for abdominal pain, diarrhea, nausea and vomiting.  Psychiatric/Behavioral: Negative for depression. The patient is not nervous/anxious.     Past Medical History:  Diagnosis Date  . GERD (gastroesophageal reflux disease)   . HIV disease (HCC)   . Ulcer     Social History   Tobacco Use  . Smoking status: Never Smoker  . Smokeless tobacco: Never Used  Substance Use Topics  . Alcohol use: Not Currently    Alcohol/week: 0.0 standard drinks    Comment: occasional  . Drug use: No    Family History  Problem Relation Age of Onset  . Heart attack Father 7  . Colon cancer Neg Hx     No Known Allergies  Health Maintenance  Topic Date Due  . COLONOSCOPY (Pts 45-40yrs Insurance coverage will need to be confirmed)  Never done  . TETANUS/TDAP  03/08/2016  . COVID-19 Vaccine (2 - Pfizer risk 4-dose series) 10/10/2019  . INFLUENZA VACCINE  09/06/2020  . Hepatitis C Screening  Completed  . HIV Screening  Completed  . HPV VACCINES  Aged Out    Objective:  Vitals:   06/09/20 0910  BP: (!) 160/92  Pulse: 69  Temp: 98.1 F (36.7 C)  TempSrc: Oral  SpO2: 100%  Weight: 235 lb (106.6 kg)  Height: 6\' 1"  (1.854 m)   Body mass index is 31  kg/m.  Physical Exam Constitutional:      Comments: He is in very good spirits as usual.  Cardiovascular:     Rate and Rhythm: Normal rate and regular rhythm.     Heart sounds: No murmur heard.   Pulmonary:     Effort: Pulmonary effort is normal.     Breath sounds: Normal breath sounds.  Abdominal:     Palpations: Abdomen is soft.     Tenderness: There is no abdominal tenderness.  Psychiatric:        Mood and Affect: Mood normal.     Lab Results Lab Results  Component Value Date   WBC 2.9 (L) 05/27/2020   HGB 13.9 05/27/2020   HCT 41.9 05/27/2020   MCV 92.7 05/27/2020   PLT 177 05/27/2020    Lab Results  Component Value Date   CREATININE 0.93  05/27/2020   BUN 14 05/27/2020   NA 137 05/27/2020   K 4.3 05/27/2020   CL 103 05/27/2020   CO2 28 05/27/2020    Lab Results  Component Value Date   ALT 20 05/27/2020   AST 15 05/27/2020   ALKPHOS 70 10/12/2015   BILITOT 1.0 05/27/2020    Lab Results  Component Value Date   CHOL 134 05/27/2020   HDL 46 05/27/2020   LDLCALC 73 05/27/2020   TRIG 66 05/27/2020   CHOLHDL 2.9 05/27/2020   Lab Results  Component Value Date   LABRPR REACTIVE (A) 05/27/2020   RPRTITER 1:1 (H) 05/27/2020   HIV 1 RNA Quant  Date Value  05/27/2020 <20 Copies/mL (H)  04/01/2019 <20 NOT DETECTED copies/mL  03/01/2018 <20 NOT DETECTED copies/mL   HIV-1 RNA Viral Load (no units)  Date Value  08/05/2012 <50   CD4 (no units)  Date Value  08/05/2012 237   CD4 T Cell Abs (/uL)  Date Value  05/27/2020 302 (L)  04/01/2019 429  03/01/2018 320 (L)     Problem List Items Addressed This Visit      High   HIV (human immunodeficiency virus infection) (HCC)    His infection remains under excellent, long-term control he will continue Biktarvy and follow-up after lab work in 1 year.  I did recommend a COVID booster vaccine.      Relevant Medications   bictegravir-emtricitabine-tenofovir AF (BIKTARVY) 50-200-25 MG TABS tablet   Other Relevant Orders   CBC   T-helper cell (CD4)- ([facility] clinic only)   Comprehensive metabolic panel   Lipid panel   RPR   HIV-1 RNA quant-no reflex-bld     Unprioritized   Elevated blood-pressure reading without diagnosis of hypertension    I asked him to schedule a follow-up visit with his PCP.      Late latent syphilis    His RPR remains low reactive and is serofast.  He does not need retreatment.      Relevant Medications   bictegravir-emtricitabine-tenofovir AF (BIKTARVY) 50-200-25 MG TABS tablet        Cliffton Asters, MD [facility] for [facility] Disease [facility] [phone] pager   [phone] cell 06/09/2020, 9:24 AM

## 2020-06-09 NOTE — Assessment & Plan Note (Signed)
I asked him to schedule a follow-up visit with his PCP.

## 2020-06-09 NOTE — Assessment & Plan Note (Signed)
His RPR remains low reactive and is serofast.  He does not need retreatment.

## 2020-06-09 NOTE — Assessment & Plan Note (Signed)
His infection remains under excellent, long-term control he will continue Biktarvy and follow-up after lab work in 1 year.  I did recommend a COVID booster vaccine.

## 2021-01-07 ENCOUNTER — Telehealth: Payer: Self-pay

## 2021-01-07 ENCOUNTER — Other Ambulatory Visit (HOSPITAL_COMMUNITY): Payer: Self-pay

## 2021-01-07 NOTE — Telephone Encounter (Signed)
[facility] Patient Advocate Encounter   Received notification from [employer] that prior authorization for Francis Pacheco is required.   PA submitted on 01/27/21 Key [id number] Status is pending    [facility] will continue to follow.   Clearance Coots, CPhT Specialty Pharmacy Patient [facility] for Infectious Disease Phone: [phone] Fax:  [phone]

## 2021-01-07 NOTE — Telephone Encounter (Signed)
error 

## 2021-01-10 ENCOUNTER — Telehealth: Payer: Self-pay

## 2021-01-10 NOTE — Telephone Encounter (Signed)
[facility] Patient Advocate Encounter  Prior Authorization for Susanne Borders  has been approved.    PA# [id number] Effective dates: 01/07/21 through 01/07/22  [facility]   [facility] will continue to follow.  Clearance Coots, CPhT Specialty Pharmacy Patient [facility] for Infectious Disease Phone: [phone] Fax:  [phone]

## 2021-06-07 ENCOUNTER — Other Ambulatory Visit: Payer: 59

## 2021-06-07 ENCOUNTER — Other Ambulatory Visit: Payer: Self-pay

## 2021-06-07 DIAGNOSIS — Z21 Asymptomatic human immunodeficiency virus [HIV] infection status: Secondary | ICD-10-CM

## 2021-06-08 LAB — T-HELPER CELL (CD4) - (RCID CLINIC ONLY)
CD4 % Helper T Cell: 21 % — ABNORMAL LOW (ref 33–65)
CD4 T Cell Abs: 350 /uL — ABNORMAL LOW (ref 400–1790)

## 2021-06-11 LAB — HIV-1 RNA QUANT-NO REFLEX-BLD
HIV 1 RNA Quant: NOT DETECTED copies/mL
HIV-1 RNA Quant, Log: NOT DETECTED Log copies/mL

## 2021-06-11 LAB — COMPREHENSIVE METABOLIC PANEL
AG Ratio: 1.7 (calc) (ref 1.0–2.5)
ALT: 20 U/L (ref 9–46)
AST: 14 U/L (ref 10–35)
Albumin: 4.5 g/dL (ref 3.6–5.1)
Alkaline phosphatase (APISO): 76 U/L (ref 35–144)
BUN: 17 mg/dL (ref 7–25)
CO2: 28 mmol/L (ref 20–32)
Calcium: 9.4 mg/dL (ref 8.6–10.3)
Chloride: 103 mmol/L (ref 98–110)
Creat: 1.12 mg/dL (ref 0.70–1.30)
Globulin: 2.6 g/dL (calc) (ref 1.9–3.7)
Glucose, Bld: 91 mg/dL (ref 65–99)
Potassium: 4.4 mmol/L (ref 3.5–5.3)
Sodium: 138 mmol/L (ref 135–146)
Total Bilirubin: 0.6 mg/dL (ref 0.2–1.2)
Total Protein: 7.1 g/dL (ref 6.1–8.1)

## 2021-06-11 LAB — CBC
HCT: 40 % (ref 38.5–50.0)
Hemoglobin: 13.7 g/dL (ref 13.2–17.1)
MCH: 31 pg (ref 27.0–33.0)
MCHC: 34.3 g/dL (ref 32.0–36.0)
MCV: 90.5 fL (ref 80.0–100.0)
MPV: 10.7 fL (ref 7.5–12.5)
Platelets: 178 10*3/uL (ref 140–400)
RBC: 4.42 10*6/uL (ref 4.20–5.80)
RDW: 12.3 % (ref 11.0–15.0)
WBC: 3.2 10*3/uL — ABNORMAL LOW (ref 3.8–10.8)

## 2021-06-11 LAB — RPR: RPR Ser Ql: REACTIVE — AB

## 2021-06-11 LAB — LIPID PANEL
Cholesterol: 146 mg/dL (ref <200)
HDL: 49 mg/dL (ref >=40)
LDL Cholesterol (Calc): 78 mg/dL (calc)
Non-HDL Cholesterol (Calc): 97 mg/dL (calc) (ref <130)
Total CHOL/HDL Ratio: 3 (calc) (ref <5.0)
Triglycerides: 105 mg/dL (ref <150)

## 2021-06-11 LAB — FLUORESCENT TREPONEMAL AB(FTA)-IGG-BLD: Fluorescent Treponemal ABS: REACTIVE — AB

## 2021-06-11 LAB — RPR TITER: RPR Titer: 1:1 {titer} — ABNORMAL HIGH

## 2021-06-20 ENCOUNTER — Telehealth: Payer: Self-pay

## 2021-06-20 NOTE — Telephone Encounter (Signed)
Patient called to confirm his appointment with Dr. Orvan Falconer tomorrow. ? ?Patient states he will not be able to come in until 4:30pm tomorrow, after he gets off work.  ? ?Will route to reception to reschedule. ? ?Wyvonne Lenz, RN  ?

## 2021-06-21 ENCOUNTER — Other Ambulatory Visit: Payer: Self-pay

## 2021-06-21 ENCOUNTER — Ambulatory Visit (INDEPENDENT_AMBULATORY_CARE_PROVIDER_SITE_OTHER): Payer: 59 | Admitting: Internal Medicine

## 2021-06-21 ENCOUNTER — Encounter: Payer: Self-pay | Admitting: Internal Medicine

## 2021-06-21 DIAGNOSIS — Z21 Asymptomatic human immunodeficiency virus [HIV] infection status: Secondary | ICD-10-CM

## 2021-06-21 MED ORDER — BICTEGRAVIR-EMTRICITAB-TENOFOV 50-200-25 MG PO TABS: 1.0000 | ORAL_TABLET | Freq: Every day | ORAL | 11 refills | Status: DC

## 2021-06-21 NOTE — Progress Notes (Signed)
? ?   ? ? ? ? ?Patient Active Problem List  ? Diagnosis Date Noted  ? HIV (human immunodeficiency virus infection) (HCC) 07/25/2010  ?  Priority: High  ?  Class: Diagnosis of  ? 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 Chlamydia 02/21/2017  ? Obesity 12/06/2015  ? Elevated blood-pressure reading without diagnosis of hypertension 12/06/2015  ? Hyperglycemia 12/06/2015  ? Esophageal ulcer 11/21/2010  ?  Class: History of  ? Seborrhea 08/17/2010  ?  Class: History of  ? Periodontal disease 08/09/2010  ?  Class: History of  ? VENEREAL WART 01/03/2010  ?  Class: Question of  ? RECTAL BLEEDING 01/03/2010  ?  Class: History of  ? ? ?Patient's Medications  ?New Prescriptions  ? No medications on file  ?Previous Medications  ? IBUPROFEN (ADVIL,MOTRIN) 100 MG TABLET    Take 100 mg by mouth every 6 (six) hours as needed.  ?Modified Medications  ? Modified Medication Previous Medication  ? BICTEGRAVIR-EMTRICITABINE-TENOFOVIR AF (BIKTARVY) 50-200-25 MG TABS TABLET bictegravir-emtricitabine-tenofovir AF (BIKTARVY) 50-200-25 MG TABS tablet  ?    Take 1 tablet by mouth daily.    Take 1 tablet by mouth daily.  ?Discontinued Medications  ? No medications on file  ? ? ?Subjective: ?Francis Pacheco is in for his routine HIV follow-up visit.  He denies any problems obtaining, taking or tolerating his Biktarvy and says that he does not miss doses.  He is not on any new medications since his last visit.  He does have a new PCP.  His last COVID booster vaccine was last fall. ? ?Review of Systems: ?Review of Systems  ?Constitutional:  Negative for weight loss.  ?Psychiatric/Behavioral:  Negative for depression.   ? ?Past Medical History:  ?Diagnosis Date  ? GERD (gastroesophageal reflux disease)   ? HIV disease (HCC)   ? Ulcer   ? ? ?Social History  ? ?Tobacco Use  ? Smoking status: Never  ? Smokeless tobacco: Never  ?Substance Use Topics  ? Alcohol use: Not Currently  ?  Alcohol/week: 0.0 standard drinks  ?  Comment: occasional  ? Drug use: No   ? ? ?Family History  ?Problem Relation Age of Onset  ? Heart attack Father 58  ? Colon cancer Neg Hx   ? ? ?No Known Allergies ? ?Health Maintenance  ?Topic Date Due  ? Zoster Vaccines- Shingrix (1 of 2) Never done  ? COLONOSCOPY (Pts 45-89yrs Insurance coverage will need to be confirmed)  Never done  ? COVID-19 Vaccine (2 - Pfizer risk series) 10/10/2019  ? INFLUENZA VACCINE  09/06/2021  ? TETANUS/TDAP  09/22/2030  ? Hepatitis C Screening  Completed  ? HIV Screening  Completed  ? HPV VACCINES  Aged Out  ? ? ?Objective: ? ?Vitals:  ? 06/21/21 1630  ?BP: (!) 152/93  ?Pulse: 69  ?Temp: 97.7 ?F (36.5 ?C)  ?TempSrc: Temporal  ?Weight: 234 lb (106.1 kg)  ? ?Body mass index is 30.87 kg/m?. ? ?Physical Exam ?Constitutional:   ?   Comments: He is in his usual good spirits.  ?Cardiovascular:  ?   Rate and Rhythm: Normal rate.  ?Pulmonary:  ?   Effort: Pulmonary effort is normal.  ?Psychiatric:     ?   Mood and Affect: Mood normal.  ? ? ?Lab Results ?Lab Results  ?Component Value Date  ? WBC 3.2 (L) 06/07/2021  ? HGB 13.7 06/07/2021  ? HCT 40.0 06/07/2021  ? MCV 90.5 06/07/2021  ? PLT 178 06/07/2021  ?  ?  Lab Results  ?Component Value Date  ? CREATININE 1.12 06/07/2021  ? BUN 17 06/07/2021  ? NA 138 06/07/2021  ? K 4.4 06/07/2021  ? CL 103 06/07/2021  ? CO2 28 06/07/2021  ?  ?Lab Results  ?Component Value Date  ? ALT 20 06/07/2021  ? AST 14 06/07/2021  ? ALKPHOS 70 10/12/2015  ? BILITOT 0.6 06/07/2021  ?  ?Lab Results  ?Component Value Date  ? CHOL 146 06/07/2021  ? HDL 49 06/07/2021  ? LDLCALC 78 06/07/2021  ? TRIG 105 06/07/2021  ? CHOLHDL 3.0 06/07/2021  ? ?Lab Results  ?Component Value Date  ? LABRPR REACTIVE (A) 06/07/2021  ? RPRTITER 1:1 (H) 06/07/2021  ? ?HIV 1 RNA Quant  ?Date Value  ?06/07/2021 NOT DETECTED copies/mL  ?05/27/2020 <20 Copies/mL (H)  ?04/01/2019 <20 NOT DETECTED copies/mL  ? ?HIV-1 RNA Viral Load (no units)  ?Date Value  ?08/05/2012 <50  ? ?CD4 (no units)  ?Date Value  ?08/05/2012 237  ? ?CD4 T Cell  Abs (/uL)  ?Date Value  ?06/07/2021 350 (L)  ?05/27/2020 302 (L)  ?04/01/2019 429  ? ?  ?Problem List Items Addressed This Visit   ? ?  ? High  ? HIV (human immunodeficiency virus infection) (HCC)  ?  His infection remains under excellent, long-term control.  He will continue Biktarvy and follow-up after lab work in 1 year. ? ?  ?  ? Relevant Medications  ? bictegravir-emtricitabine-tenofovir AF (BIKTARVY) 50-200-25 MG TABS tablet  ? Other Relevant Orders  ? CBC  ? T-helper cells (CD4) count (not at [facility])  ? Comprehensive metabolic panel  ? Lipid panel  ? RPR  ? HIV-1 RNA quant-no reflex-bld  ? ? ? ? ?Cliffton Asters, MD ?[facility] for Infectious Disease ?[facility] ?[phone] pager   [phone] cell ?06/21/2021, 4:41 PM ? ?

## 2021-06-21 NOTE — Assessment & Plan Note (Signed)
His infection remains under excellent, long-term control.  He will continue Biktarvy and follow-up after lab work in 1 year. 

## 2022-01-20 ENCOUNTER — Other Ambulatory Visit (HOSPITAL_COMMUNITY): Payer: Self-pay

## 2022-01-24 ENCOUNTER — Telehealth: Payer: Self-pay

## 2022-01-24 NOTE — Telephone Encounter (Signed)
[facility] Patient Advocate Encounter   Received notification from [employer] that prior authorization for Susanne Borders is required.   PA submitted on 01/24/22 Key [id number] Status is pending    Faxed chart notes and labs to [employer] [phone] [employer] # [phone]    [facility] will continue to follow.   Clearance Coots, CPhT Specialty Pharmacy Patient [facility] for Infectious Disease Phone: [phone] Fax:  [phone]

## 2022-02-02 ENCOUNTER — Telehealth: Payer: Self-pay

## 2022-02-02 NOTE — Telephone Encounter (Signed)
[facility] Patient Advocate Encounter  Prior Authorization for Susanne Borders has been approved.    PA# [id number] Effective dates: 02/02/22 through 02/02/23    [facility] will continue to follow.  Clearance Coots, CPhT Specialty Pharmacy Patient [facility] for Infectious Disease Phone: [phone] Fax:  [phone]

## 2022-07-06 ENCOUNTER — Other Ambulatory Visit: Payer: Self-pay

## 2022-07-06 DIAGNOSIS — Z21 Asymptomatic human immunodeficiency virus [HIV] infection status: Secondary | ICD-10-CM

## 2022-07-06 MED ORDER — BICTEGRAVIR-EMTRICITAB-TENOFOV 50-200-25 MG PO TABS: 1.0000 | ORAL_TABLET | Freq: Every day | ORAL | 0 refills | Status: DC

## 2022-07-10 ENCOUNTER — Other Ambulatory Visit: Payer: Self-pay | Admitting: Pharmacist

## 2022-07-10 DIAGNOSIS — Z21 Asymptomatic human immunodeficiency virus [HIV] infection status: Secondary | ICD-10-CM

## 2022-07-10 MED ORDER — BIKTARVY 50-200-25 MG PO TABS: 1.0000 | ORAL_TABLET | Freq: Every day | ORAL | 0 refills | Status: AC

## 2022-07-10 NOTE — Progress Notes (Signed)
Medication Samples have been provided to the patient.  Drug name: Biktarvy        Strength: 50/200/25 mg       Qty: 1 bottle (7 tablets)   LOT: [id number]   Exp.Date: 09/2024  Dosing instructions: Take one tablet by mouth once daily  The patient has been instructed regarding the correct time, dose, and frequency of taking this medication, including desired effects and most common side effects.   Toussaint Golson L. Nyomie Ehrlich, PharmD, BCIDP, AAHIVP, CPP Clinical Pharmacist Practitioner Infectious Diseases Clinical Pharmacist [facility] 01/19/2020, 10:07 AM  

## 2022-07-17 NOTE — Progress Notes (Signed)
The 10-year ASCVD risk score (Arnett DK, et al., 2019) is: 9.3%   Values used to calculate the score:     Age: 57 years     Sex: Male     Is Non-Hispanic African American: Yes     Diabetic: No     Tobacco smoker: No     Systolic Blood Pressure: 151 mmHg     Is BP treated: No     HDL Cholesterol: 49 mg/dL     Total Cholesterol: 146 mg/dL  Sandie Ano, RN

## 2022-07-24 ENCOUNTER — Other Ambulatory Visit: Payer: Self-pay

## 2022-07-24 ENCOUNTER — Encounter: Payer: Self-pay | Admitting: Family

## 2022-07-24 ENCOUNTER — Ambulatory Visit (INDEPENDENT_AMBULATORY_CARE_PROVIDER_SITE_OTHER): Payer: 59 | Admitting: Family

## 2022-07-24 VITALS — BP 146/87 | HR 99 | Resp 16 | Ht 73.0 in | Wt 234.0 lb

## 2022-07-24 DIAGNOSIS — Z9189 Other specified personal risk factors, not elsewhere classified: Secondary | ICD-10-CM

## 2022-07-24 DIAGNOSIS — Z21 Asymptomatic human immunodeficiency virus [HIV] infection status: Secondary | ICD-10-CM

## 2022-07-24 DIAGNOSIS — A528 Late syphilis, latent: Secondary | ICD-10-CM | POA: Diagnosis not present

## 2022-07-24 DIAGNOSIS — Z79899 Other long term (current) drug therapy: Secondary | ICD-10-CM | POA: Diagnosis not present

## 2022-07-24 DIAGNOSIS — Z0000 Encounter for general adult medical examination without abnormal findings: Secondary | ICD-10-CM | POA: Insufficient documentation

## 2022-07-24 DIAGNOSIS — B20 Human immunodeficiency virus [HIV] disease: Secondary | ICD-10-CM

## 2022-07-24 MED ORDER — BICTEGRAVIR-EMTRICITAB-TENOFOV 50-200-25 MG PO TABS: 1.0000 | ORAL_TABLET | Freq: Every day | ORAL | 11 refills | Status: DC

## 2022-07-24 NOTE — Patient Instructions (Addendum)
Nice to see you.  We will check your lab work today.  Continue to take your medication daily as prescribed.  Refills have been sent to the pharmacy.  Plan for follow up in 1 year or sooner if needed with lab work on the same day.  Have a great day and stay safe!  

## 2022-07-24 NOTE — Assessment & Plan Note (Addendum)
To have well-controlled virus with good adherence and tolerance to Biktarvy.  Reviewed previous lab work and discussed plan of care and U equals U.  Continue current dose of Biktarvy.  Check lab work. He met with financial assistance.  Requesting referral to [facility] as that is closer to his house in [place]  Plan for follow-up in 1 year or sooner if needed if decided to stay with [facility].

## 2022-07-24 NOTE — Assessment & Plan Note (Signed)
Serofast at 1:1. Recheck RPR.

## 2022-07-24 NOTE — Assessment & Plan Note (Signed)
Discussed importance of safe sexual practice and condom use. Condoms and STD testing offered.  Declines vaccinations Encouraged routine dental care and can refer to [facility] if needed.  Due for colon cancer screening with referral placed to previous provider.

## 2022-07-24 NOTE — Progress Notes (Signed)
Brief Narrative   Patient ID: Francis Francis, male    DOB: [birth year], 57 y.o.   MRN: [id number]  Francis Francis is a 57 y/o male with HIV disease diagnosed in May of 2012 with risk factor of heterosexual contact. Initial viral load was 109,000 and CD4 count 40. Entered care at [facility] Stage 3. No history of opportunistic infection. Genotype with no significant medication resistant mutations. ART experienced with Isentress/Truvada, Tivcay/Descovy, and currently Biktarvy.   Subjective:    Chief Complaint  Patient presents with   Follow-up   HIV Positive/AIDS    HPI:  Francis Francis is a 57 y.o. male with HIV disease last seen by Dr. Orvan Francis on 06/21/21 with well controlled virus and good adherence and tolerance to Biktarvy. Viral load was undetectable and CD4 count 350. RPR serofast at 1:1. Kidney function, liver function, and electrolytes within normal ranges. Here today for routine follow up.  Francis Francis has been doing very well since his last office visit and continues to take Biktarvy with no adverse side effects or problems obtaining medication. Will need to renew his SPAP. No new concerns/complaints. Would like referral to [facility] as he is living in [facility] and it is closer to his home. Due for colonoscopy and routine dental care.  Francis Francis continues declines vaccinations.  Denies fevers, chills, night sweats, headaches, changes in vision, neck pain/stiffness, nausea, diarrhea, vomiting, lesions or rashes.  No Known Allergies    Outpatient Medications Prior to Visit  Medication Sig Dispense Refill   bictegravir-emtricitabine-tenofovir AF (BIKTARVY) 50-200-25 MG TABS tablet Take 1 tablet by mouth daily. 30 tablet 0   amoxicillin-clavulanate (AUGMENTIN) 875-125 MG tablet Take 1 tablet by mouth 2 (two) times daily. (Patient not taking: Reported on 07/24/2022)     ciprofloxacin-dexamethasone (CIPRODEX) OTIC suspension PLEASE SEE ATTACHED FOR DETAILED DIRECTIONS (Patient not  taking: Reported on 07/24/2022)     ibuprofen (ADVIL,MOTRIN) 100 MG tablet Take 100 mg by mouth every 6 (six) hours as needed. (Patient not taking: Reported on 07/24/2022)     No facility-administered medications prior to visit.     Past Medical History:  Diagnosis Date   GERD (gastroesophageal reflux disease)    HIV disease (HCC)    Ulcer      Past Surgical History:  Procedure Laterality Date   COLONOSCOPY     hospitalization  06/2009   tree accident - fx 1st rib, left mid clavicle and scapular fx, left transverse process of C7-T2   MULTIPLE TOOTH EXTRACTIONS     Partial   UPPER GASTROINTESTINAL ENDOSCOPY        Review of Systems  Constitutional:  Negative for appetite change, chills, fatigue, fever and unexpected weight change.  Eyes:  Negative for visual disturbance.  Respiratory:  Negative for cough, chest tightness, shortness of breath and wheezing.   Cardiovascular:  Negative for chest pain and leg swelling.  Gastrointestinal:  Negative for abdominal pain, constipation, diarrhea, nausea and vomiting.  Genitourinary:  Negative for dysuria, flank pain, frequency, genital sores, hematuria and urgency.  Skin:  Negative for rash.  Allergic/Immunologic: Negative for immunocompromised state.  Neurological:  Negative for dizziness and headaches.      Objective:    BP (!) 146/87   Pulse 99   Resp 16   Ht 6\' 1"  (1.854 m)   Wt 234 lb (106.1 kg)   SpO2 98%   BMI 30.87 kg/m  Nursing note and vital signs reviewed.  Physical Exam Constitutional:  General: He is not in acute distress.    Appearance: He is well-developed.  Eyes:     Conjunctiva/sclera: Conjunctivae normal.  Cardiovascular:     Rate and Rhythm: Normal rate and regular rhythm.     Heart sounds: Normal heart sounds. No murmur heard.    No friction rub. No gallop.  Pulmonary:     Effort: Pulmonary effort is normal. No respiratory distress.     Breath sounds: Normal breath sounds. No wheezing or rales.   Chest:     Chest wall: No tenderness.  Abdominal:     General: Bowel sounds are normal.     Palpations: Abdomen is soft.     Tenderness: There is no abdominal tenderness.  Musculoskeletal:     Cervical back: Neck supple.  Lymphadenopathy:     Cervical: No cervical adenopathy.  Skin:    General: Skin is warm and dry.     Findings: No rash.  Neurological:     Mental Status: He is alert and oriented to person, place, and time.  Psychiatric:        Behavior: Behavior normal.        Thought Content: Thought content normal.        Judgment: Judgment normal.         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   Chlamydia 02/21/2017   Obesity 12/06/2015   Elevated blood-pressure reading without diagnosis of hypertension 12/06/2015   Hyperglycemia 12/06/2015   Esophageal ulcer 11/21/2010    Class: History of   Seborrhea 08/17/2010    Class: History of   Periodontal disease 08/09/2010    Class: History of   HIV (human immunodeficiency virus infection) (HCC) 07/25/2010    Class: Diagnosis of   VENEREAL WART 01/03/2010    Class: Question of   RECTAL BLEEDING 01/03/2010    Class: History of     Problem List Items Addressed This Visit       Other   HIV (human immunodeficiency virus infection) (HCC) - Primary    To have well-controlled virus with good adherence and tolerance to Biktarvy.  Reviewed previous lab work and discussed plan of care and U equals U.  Continue current dose of Biktarvy.  Check lab work. He met with financial assistance.  Requesting referral to [facility] as that is closer to his house in [place] [facility].  Plan for  follow-up in 1 year or sooner if needed if decided to stay with [facility].      Relevant Medications   bictegravir-emtricitabine-tenofovir AF (BIKTARVY) 50-200-25 MG TABS tablet   Other Relevant Orders   Ambulatory referral to Infectious Disease   COMPLETE METABOLIC PANEL WITH GFR   HIV-1 RNA quant-no reflex-bld   T-helper cells (CD4) count (not at [facility])   Late latent syphilis    Serofast at 1:1. Recheck RPR.       Relevant Medications   bictegravir-emtricitabine-tenofovir AF (BIKTARVY) 50-200-25 MG TABS tablet   Other Relevant Orders   RPR  Healthcare maintenance    Discussed importance of safe sexual practice and condom use. Condoms and STD testing offered.  Declines vaccinations Encouraged routine dental care and can refer to [facility] if needed.  Due for colon cancer screening with referral placed to previous provider.       Other Visit Diagnoses     At risk for colon cancer       Relevant Orders   Ambulatory referral to Gastroenterology   Pharmacologic therapy       Relevant Orders   Lipid panel        I have discontinued Francis Francis's ibuprofen, amoxicillin-clavulanate, and ciprofloxacin-dexamethasone. I am also having him maintain his bictegravir-emtricitabine-tenofovir AF.   Meds ordered this encounter  Medications   bictegravir-emtricitabine-tenofovir AF (BIKTARVY) 50-200-25 MG TABS tablet    Sig: Take 1 tablet by mouth daily.    Dispense:  30 tablet    Refill:  11    Order Specific Question:   Supervising Provider    Answer:   Judyann Munson [[id number]]     Follow-up: Return in about 1 year (around 07/24/2023), or if symptoms worsen or fail to improve.   Marcos Eke, MSN, FNP-C Nurse Practitioner [facility] Main number: [phone]

## 2022-07-25 LAB — LIPID PANEL
HDL: 53 mg/dL (ref >=40)
Non-HDL Cholesterol (Calc): 100 mg/dL (calc) (ref <130)

## 2022-07-26 LAB — COMPLETE METABOLIC PANEL WITH GFR
AG Ratio: 1.6 (calc) (ref 1.0–2.5)
ALT: 19 U/L (ref 9–46)
AST: 16 U/L (ref 10–35)
Albumin: 4.6 g/dL (ref 3.6–5.1)
Alkaline phosphatase (APISO): 64 U/L (ref 35–144)
BUN: 16 mg/dL (ref 7–25)
CO2: 29 mmol/L (ref 20–32)
Calcium: 9.8 mg/dL (ref 8.6–10.3)
Chloride: 104 mmol/L (ref 98–110)
Creat: 1.1 mg/dL (ref 0.70–1.30)
Globulin: 2.8 g/dL (calc) (ref 1.9–3.7)
Glucose, Bld: 95 mg/dL (ref 65–99)
Potassium: 4.6 mmol/L (ref 3.5–5.3)
Sodium: 138 mmol/L (ref 135–146)
Total Bilirubin: 0.7 mg/dL (ref 0.2–1.2)
Total Protein: 7.4 g/dL (ref 6.1–8.1)
eGFR: 78 mL/min/{1.73_m2} (ref >=60)

## 2022-07-26 LAB — LIPID PANEL
Cholesterol: 153 mg/dL (ref <200)
LDL Cholesterol (Calc): 86 mg/dL (calc)
Total CHOL/HDL Ratio: 2.9 (calc) (ref <5.0)
Triglycerides: 54 mg/dL (ref <150)

## 2022-07-26 LAB — T-HELPER CELLS (CD4) COUNT (NOT AT ARMC)
Absolute CD4: 355 cells/uL — ABNORMAL LOW (ref 490–1740)
CD4 T Helper %: 21 % — ABNORMAL LOW (ref 30–61)
Total lymphocyte count: 1683 cells/uL (ref 850–3900)

## 2022-07-26 LAB — HIV-1 RNA QUANT-NO REFLEX-BLD
HIV 1 RNA Quant: NOT DETECTED Copies/mL
HIV-1 RNA Quant, Log: NOT DETECTED Log cps/mL

## 2022-07-26 LAB — RPR TITER: RPR Titer: 1:16 {titer} — ABNORMAL HIGH

## 2022-07-26 LAB — RPR: RPR Ser Ql: REACTIVE — AB

## 2022-07-26 LAB — T PALLIDUM AB: T Pallidum Abs: POSITIVE — AB

## 2022-07-27 ENCOUNTER — Telehealth: Payer: Self-pay

## 2022-07-27 MED ORDER — DOXYCYCLINE HYCLATE 100 MG PO TABS: 100.0000 mg | ORAL_TABLET | Freq: Two times a day (BID) | ORAL | 0 refills | Status: DC

## 2022-07-27 NOTE — Telephone Encounter (Signed)
Patient advised of positive syphilis results and would rather take doxy 100 mg  bid for 28 days.  Per Tammy Sours he will send in doxy for the patient. Patient advised no sex for 10 days after treatment. Advised to let his partner know to be tested and treated.  Beaumont Austad T Pricilla Loveless

## 2022-07-27 NOTE — Addendum Note (Signed)
Addended by: Marcell Anger on: 07/27/2022 04:54 PM   Modules accepted: Orders

## 2022-07-27 NOTE — Telephone Encounter (Signed)
-----   Message from Veryl Speak, FNP sent at 07/27/2022  4:29 PM EDT ----- Please schedule Mr. Hartig for syphilis treatment with 3 weekly injections of 2.4 million units Bicillin IM. Thanks.

## 2022-07-28 ENCOUNTER — Ambulatory Visit: Payer: 59

## 2022-07-31 ENCOUNTER — Other Ambulatory Visit: Payer: Self-pay

## 2022-07-31 MED ORDER — DOXYCYCLINE HYCLATE 100 MG PO TABS: 100.0000 mg | ORAL_TABLET | Freq: Two times a day (BID) | ORAL | 0 refills | Status: AC

## 2022-07-31 NOTE — Telephone Encounter (Signed)
Patient called asking if Doxyxycline was sent to pharmacy.   RX sent to wrong pharmacy - RX resent to [facility] on [address] in [place].  RX canceled at [facility] on [address].    Latese Dufault Lesli Albee, CMA

## 2022-08-03 DIAGNOSIS — F419 Anxiety disorder, unspecified: Secondary | ICD-10-CM | POA: Diagnosis not present

## 2022-08-03 DIAGNOSIS — F528 Other sexual dysfunction not due to a substance or known physiological condition: Secondary | ICD-10-CM | POA: Diagnosis not present

## 2022-08-07 ENCOUNTER — Ambulatory Visit: Payer: 59

## 2022-08-29 ENCOUNTER — Other Ambulatory Visit: Payer: Self-pay | Admitting: Family

## 2022-09-11 DIAGNOSIS — F4322 Adjustment disorder with anxiety: Secondary | ICD-10-CM | POA: Diagnosis not present

## 2022-10-10 ENCOUNTER — Other Ambulatory Visit (HOSPITAL_COMMUNITY): Payer: Self-pay

## 2022-10-30 DIAGNOSIS — F4322 Adjustment disorder with anxiety: Secondary | ICD-10-CM | POA: Diagnosis not present

## 2022-11-21 DIAGNOSIS — F4322 Adjustment disorder with anxiety: Secondary | ICD-10-CM | POA: Diagnosis not present

## 2022-12-26 DIAGNOSIS — F4322 Adjustment disorder with anxiety: Secondary | ICD-10-CM | POA: Diagnosis not present

## 2023-07-06 NOTE — Progress Notes (Signed)
 The 10-year ASCVD risk score (Arnett DK, et al., 2019) is: 9%   Values used to calculate the score:     Age: 58 years     Sex: Male     Is Non-Hispanic African American: Yes     Diabetic: No     Tobacco smoker: No     Systolic Blood Pressure: 146 mmHg     Is BP treated: No     HDL Cholesterol: 53 mg/dL     Total Cholesterol: 153 mg/dL  No current statin therapy, next appointment note updated.   Buena Boehm, BSN, RN

## 2023-07-17 ENCOUNTER — Telehealth: Payer: Self-pay

## 2023-07-17 DIAGNOSIS — Z21 Asymptomatic human immunodeficiency virus [HIV] infection status: Secondary | ICD-10-CM

## 2023-07-17 NOTE — Telephone Encounter (Signed)
 Patient requested referral for [facility] in [facility]. States he would prefer to be seen closer to [place] area. Will place referral. Pt requested to cancel follow up with [facility] at this time.  Julien Odor, RMA

## 2023-07-24 ENCOUNTER — Ambulatory Visit: Payer: 59 | Admitting: Family

## 2023-08-12 ENCOUNTER — Other Ambulatory Visit: Payer: Self-pay | Admitting: Family

## 2023-08-12 DIAGNOSIS — Z21 Asymptomatic human immunodeficiency virus [HIV] infection status: Secondary | ICD-10-CM
